# Patient Record
Sex: Male | Born: 1960
Health system: Southern US, Community
[De-identification: ages and names within clinical notes are randomized; demographics above are authoritative.]

## PROBLEM LIST (undated history)

## (undated) DIAGNOSIS — I10 Essential (primary) hypertension: Secondary | ICD-10-CM

## (undated) HISTORY — PX: COLONOSCOPY: SHX174

## (undated) HISTORY — DX: Essential (primary) hypertension: I10

---

## 2018-11-05 DIAGNOSIS — K219 Gastro-esophageal reflux disease without esophagitis: Secondary | ICD-10-CM | POA: Diagnosis not present

## 2018-11-05 DIAGNOSIS — Z0000 Encounter for general adult medical examination without abnormal findings: Secondary | ICD-10-CM | POA: Diagnosis not present

## 2018-11-05 DIAGNOSIS — E349 Endocrine disorder, unspecified: Secondary | ICD-10-CM | POA: Diagnosis not present

## 2018-11-05 DIAGNOSIS — Z131 Encounter for screening for diabetes mellitus: Secondary | ICD-10-CM | POA: Diagnosis not present

## 2018-11-05 DIAGNOSIS — R0683 Snoring: Secondary | ICD-10-CM | POA: Insufficient documentation

## 2018-11-05 DIAGNOSIS — E7800 Pure hypercholesterolemia, unspecified: Secondary | ICD-10-CM | POA: Diagnosis not present

## 2018-11-05 DIAGNOSIS — Z125 Encounter for screening for malignant neoplasm of prostate: Secondary | ICD-10-CM | POA: Diagnosis not present

## 2018-12-07 DIAGNOSIS — R6882 Decreased libido: Secondary | ICD-10-CM | POA: Diagnosis not present

## 2019-06-07 DIAGNOSIS — H40039 Anatomical narrow angle, unspecified eye: Secondary | ICD-10-CM | POA: Diagnosis not present

## 2020-03-06 ENCOUNTER — Ambulatory Visit: Payer: BC Managed Care – PPO | Admitting: Adult Health

## 2020-03-06 ENCOUNTER — Other Ambulatory Visit: Payer: Self-pay

## 2020-03-06 ENCOUNTER — Encounter: Payer: Self-pay | Admitting: Adult Health

## 2020-03-06 VITALS — BP 138/90 | HR 74 | Temp 97.1°F | Resp 16 | Ht 60.3 in | Wt 218.6 lb

## 2020-03-06 DIAGNOSIS — Z23 Encounter for immunization: Secondary | ICD-10-CM

## 2020-03-06 DIAGNOSIS — Z7689 Persons encountering health services in other specified circumstances: Secondary | ICD-10-CM | POA: Diagnosis not present

## 2020-03-06 DIAGNOSIS — E7849 Other hyperlipidemia: Secondary | ICD-10-CM | POA: Diagnosis not present

## 2020-03-06 DIAGNOSIS — N529 Male erectile dysfunction, unspecified: Secondary | ICD-10-CM | POA: Diagnosis not present

## 2020-03-06 DIAGNOSIS — Z125 Encounter for screening for malignant neoplasm of prostate: Secondary | ICD-10-CM

## 2020-03-06 DIAGNOSIS — Z1211 Encounter for screening for malignant neoplasm of colon: Secondary | ICD-10-CM | POA: Diagnosis not present

## 2020-03-06 DIAGNOSIS — Z79899 Other long term (current) drug therapy: Secondary | ICD-10-CM

## 2020-03-06 MED ORDER — TADALAFIL 10 MG PO TABS: 10.0000 mg | ORAL_TABLET | Freq: Every day | ORAL | 0 refills | Status: DC | PRN

## 2020-03-06 NOTE — Progress Notes (Signed)
[facility] [place],  [address]  Internal MEDICINE  Office Visit Note  Patient Name: Fred Reeves  [id number]  [id number]  Date of Service: 03/06/2020   Complaints/HPI Pt is here for establishment of PCP. Chief Complaint  Patient presents with  . New Patient (Initial Visit)   HPI Pt is here to establish care.  Low testosterone, ED, he took testosterone injections for about 9 months. He feels like it made a difference. Bilateral leg cramping.    Quit smoking 30 years, social drinker  Current Medication: Outpatient Encounter Medications as of 03/06/2020  Medication Sig  . rosuvastatin (CRESTOR) 40 MG tablet Take 40 mg by mouth daily.  . sildenafil (VIAGRA) 100 MG tablet Take 100 mg by mouth daily as needed for erectile dysfunction.  . tadalafil (CIALIS) 10 MG tablet Take 1 tablet (10 mg total) by mouth daily as needed for erectile dysfunction.   No facility-administered encounter medications on file as of 03/06/2020.    Surgical History: Past Surgical History:  Procedure Laterality Date  . COLONOSCOPY      Medical History: History reviewed. No pertinent past medical history.  Family History: Family History  Problem Relation Age of Onset  . Stroke Mother   . Aneurysm Brother     Social History   Socioeconomic History  . Marital status: Married    Spouse name: Not on file  . Number of children: Not on file  . Years of education: Not on file  . Highest education level: Not on file  Occupational History  . Not on file  Tobacco Use  . Smoking status: Former Smoker    Types: Cigarettes  . Smokeless tobacco: Never Used  Substance and Sexual Activity  . Alcohol use: Yes    Comment: moderate  . Drug use: Never  . Sexual activity: Not on file  Other Topics Concern  . Not on file  Social History Narrative  . Not on file   Social Determinants of Health   Financial Resource Strain:   . Difficulty of Paying Living Expenses:   Food  Insecurity:   . Worried About Charity fundraiser in the Last Year:   . Arboriculturist in the Last Year:   Transportation Needs:   . Film/video editor (Medical):   Marland Kitchen Lack of Transportation (Non-Medical):   Physical Activity:   . Days of Exercise per Week:   . Minutes of Exercise per Session:   Stress:   . Feeling of Stress :   Social Connections:   . Frequency of Communication with Friends and Family:   . Frequency of Social Gatherings with Friends and Family:   . Attends Religious Services:   . Active Member of Clubs or Organizations:   . Attends Archivist Meetings:   Marland Kitchen Marital Status:   Intimate Partner Violence:   . Fear of Current or Ex-Partner:   . Emotionally Abused:   Marland Kitchen Physically Abused:   . Sexually Abused:      Review of Systems  Constitutional: Negative.  Negative for chills, fatigue and unexpected weight change.  HENT: Negative.  Negative for congestion, rhinorrhea, sneezing and sore throat.   Eyes: Negative for redness.  Respiratory: Negative.  Negative for cough, chest tightness and shortness of breath.   Cardiovascular: Negative.  Negative for chest pain and palpitations.  Gastrointestinal: Negative.  Negative for abdominal pain, constipation, diarrhea, nausea and vomiting.  Endocrine: Negative.   Genitourinary: Negative.  Negative for dysuria  and frequency.  Musculoskeletal: Negative.  Negative for arthralgias, back pain, joint swelling and neck pain.  Skin: Negative.  Negative for rash.  Allergic/Immunologic: Negative.   Neurological: Negative.  Negative for tremors and numbness.  Hematological: Negative for adenopathy. Does not bruise/bleed easily.  Psychiatric/Behavioral: Negative.  Negative for behavioral problems, sleep disturbance and suicidal ideas. The patient is not nervous/anxious.     Vital Signs: BP (!) 149/97   Pulse 74   Temp (!) 97.1 F (36.2 C)   Resp 16   Ht 5' 0.3" (1.532 m)   Wt 218 lb 9.6 oz (99.2 kg)   SpO2 98%    BMI 42.27 kg/m    Physical Exam Vitals and nursing note reviewed.  Constitutional:      General: He is not in acute distress.    Appearance: He is well-developed. He is not diaphoretic.  HENT:     Head: Normocephalic and atraumatic.     Mouth/Throat:     Pharynx: No oropharyngeal exudate.  Eyes:     Pupils: Pupils are equal, round, and reactive to light.  Neck:     Thyroid: No thyromegaly.     Vascular: No JVD.     Trachea: No tracheal deviation.  Cardiovascular:     Rate and Rhythm: Normal rate and regular rhythm.     Heart sounds: Normal heart sounds. No murmur. No friction rub. No gallop.   Pulmonary:     Effort: Pulmonary effort is normal. No respiratory distress.     Breath sounds: Normal breath sounds. No wheezing or rales.  Chest:     Chest wall: No tenderness.  Abdominal:     Palpations: Abdomen is soft.     Tenderness: There is no abdominal tenderness. There is no guarding.  Musculoskeletal:        General: Normal range of motion.     Cervical back: Normal range of motion and neck supple.  Lymphadenopathy:     Cervical: No cervical adenopathy.  Skin:    General: Skin is warm and dry.  Neurological:     Mental Status: He is alert and oriented to person, place, and time.     Cranial Nerves: No cranial nerve deficit.  Psychiatric:        Behavior: Behavior normal.        Thought Content: Thought content normal.        Judgment: Judgment normal.    Assessment/Plan: 1. Encounter to establish care with new doctor - CBC with Differential/Platelet - Lipid Panel With LDL/HDL Ratio - TSH - T4, free - Comprehensive metabolic panel  2. Other hyperlipidemia - rosuvastatin (CRESTOR) 40 MG tablet; Take 40 mg by mouth daily. - Lipid Panel With LDL/HDL Ratio  3. Erectile dysfunction, unspecified erectile dysfunction type - sildenafil (VIAGRA) 100 MG tablet; Take 100 mg by mouth daily as needed for erectile dysfunction. - tadalafil (CIALIS) 10 MG tablet; Take 1  tablet (10 mg total) by mouth daily as needed for erectile dysfunction.  Dispense: 30 tablet; Refill: 0  4. Screen for colon cancer - Ambulatory referral to Gastroenterology  5. Need for shingles vaccine - Varicella-zoster vaccine subcutaneous  6. Screening for prostate cancer - PSA  7. Encounter for long-term (current) use of high-risk medication - CBC with Differential/Platelet - Lipid Panel With LDL/HDL Ratio - TSH - T4, free - Comprehensive metabolic panel  General Counseling: Pascual verbalizes understanding of the findings of todays visit and agrees with plan of treatment. I have discussed any further diagnostic  evaluation that may be needed or ordered today. We also reviewed his medications today. he has been encouraged to call the office with any questions or concerns that should arise related to todays visit.  Orders Placed This Encounter  Procedures  . Varicella-zoster vaccine subcutaneous  . CBC with Differential/Platelet  . Lipid Panel With LDL/HDL Ratio  . TSH  . T4, free  . Comprehensive metabolic panel  . PSA  . Ambulatory referral to Gastroenterology    Meds ordered this encounter  Medications  . tadalafil (CIALIS) 10 MG tablet    Sig: Take 1 tablet (10 mg total) by mouth daily as needed for erectile dysfunction.    Dispense:  30 tablet    Refill:  0    Good Rx BIN [id number], PCN [id number], Group [id number], Member ID [id number]    Time spent: 30 Minutes   This patient was seen by Orson Gear AGNP-C in Collaboration with Dr Lavera Guise as a part of collaborative care agreement  Kendell Bane AGNP-C Internal Medicine

## 2020-03-09 ENCOUNTER — Telehealth: Payer: Self-pay

## 2020-03-09 DIAGNOSIS — Z79899 Other long term (current) drug therapy: Secondary | ICD-10-CM | POA: Diagnosis not present

## 2020-03-09 DIAGNOSIS — E7849 Other hyperlipidemia: Secondary | ICD-10-CM | POA: Diagnosis not present

## 2020-03-09 DIAGNOSIS — Z7689 Persons encountering health services in other specified circumstances: Secondary | ICD-10-CM | POA: Diagnosis not present

## 2020-03-09 DIAGNOSIS — Z125 Encounter for screening for malignant neoplasm of prostate: Secondary | ICD-10-CM | POA: Diagnosis not present

## 2020-03-09 NOTE — Telephone Encounter (Signed)
Confirmed appointment on 03/13/2020 and screened for covid. [facility]  

## 2020-03-10 LAB — CBC WITH DIFFERENTIAL/PLATELET
Basophils Absolute: 0.1 10*3/uL (ref 0.0–0.2)
Basos: 1 %
EOS (ABSOLUTE): 0.1 10*3/uL (ref 0.0–0.4)
Eos: 3 %
Hematocrit: 43.5 % (ref 37.5–51.0)
Hemoglobin: 14.3 g/dL (ref 13.0–17.7)
Immature Grans (Abs): 0 10*3/uL (ref 0.0–0.1)
Immature Granulocytes: 0 %
Lymphocytes Absolute: 2.1 10*3/uL (ref 0.7–3.1)
Lymphs: 37 %
MCH: 27.9 pg (ref 26.6–33.0)
MCHC: 32.9 g/dL (ref 31.5–35.7)
MCV: 85 fL (ref 79–97)
Monocytes Absolute: 0.6 10*3/uL (ref 0.1–0.9)
Monocytes: 10 %
Neutrophils Absolute: 2.8 10*3/uL (ref 1.4–7.0)
Neutrophils: 49 %
Platelets: 224 10*3/uL (ref 150–450)
RBC: 5.13 x10E6/uL (ref 4.14–5.80)
RDW: 12.9 % (ref 11.6–15.4)
WBC: 5.7 10*3/uL (ref 3.4–10.8)

## 2020-03-10 LAB — LIPID PANEL WITH LDL/HDL RATIO
Cholesterol, Total: 159 mg/dL (ref 100–199)
HDL: 69 mg/dL (ref >39)
LDL Chol Calc (NIH): 79 mg/dL (ref 0–99)
LDL/HDL Ratio: 1.1 ratio (ref 0.0–3.6)
Triglycerides: 55 mg/dL (ref 0–149)
VLDL Cholesterol Cal: 11 mg/dL (ref 5–40)

## 2020-03-10 LAB — COMPREHENSIVE METABOLIC PANEL
ALT: 11 IU/L (ref 0–44)
AST: 16 IU/L (ref 0–40)
Albumin/Globulin Ratio: 1.7 (ref 1.2–2.2)
Albumin: 4.5 g/dL (ref 3.8–4.8)
Alkaline Phosphatase: 73 IU/L (ref 48–121)
BUN/Creatinine Ratio: 13 (ref 10–24)
BUN: 15 mg/dL (ref 8–27)
Bilirubin Total: 0.4 mg/dL (ref 0.0–1.2)
CO2: 24 mmol/L (ref 20–29)
Calcium: 9.5 mg/dL (ref 8.6–10.2)
Chloride: 103 mmol/L (ref 96–106)
Creatinine, Ser: 1.2 mg/dL (ref 0.76–1.27)
GFR calc Af Amer: 75 mL/min/{1.73_m2} (ref >59)
GFR calc non Af Amer: 65 mL/min/{1.73_m2} (ref >59)
Globulin, Total: 2.6 g/dL (ref 1.5–4.5)
Glucose: 107 mg/dL — ABNORMAL HIGH (ref 65–99)
Potassium: 4.8 mmol/L (ref 3.5–5.2)
Sodium: 140 mmol/L (ref 134–144)
Total Protein: 7.1 g/dL (ref 6.0–8.5)

## 2020-03-10 LAB — TSH: TSH: 1.63 u[IU]/mL (ref 0.450–4.500)

## 2020-03-10 LAB — T4, FREE: Free T4: 1.01 ng/dL (ref 0.82–1.77)

## 2020-03-10 LAB — PSA: Prostate Specific Ag, Serum: 1 ng/mL (ref 0.0–4.0)

## 2020-03-13 ENCOUNTER — Ambulatory Visit
Admission: RE | Admit: 2020-03-13 | Discharge: 2020-03-13 | Disposition: A | Discharge status: Home / Self Care | Payer: BC Managed Care – PPO | Source: Ambulatory Visit | Attending: Adult Health | Admitting: Adult Health

## 2020-03-13 ENCOUNTER — Encounter: Payer: Self-pay | Admitting: Adult Health

## 2020-03-13 ENCOUNTER — Ambulatory Visit (INDEPENDENT_AMBULATORY_CARE_PROVIDER_SITE_OTHER): Payer: BC Managed Care – PPO | Admitting: Adult Health

## 2020-03-13 ENCOUNTER — Other Ambulatory Visit: Payer: Self-pay

## 2020-03-13 VITALS — BP 132/66 | HR 67 | Temp 97.5°F | Resp 16 | Ht 60.36 in | Wt 216.8 lb

## 2020-03-13 DIAGNOSIS — J90 Pleural effusion, not elsewhere classified: Secondary | ICD-10-CM | POA: Diagnosis not present

## 2020-03-13 DIAGNOSIS — R7301 Impaired fasting glucose: Secondary | ICD-10-CM

## 2020-03-13 DIAGNOSIS — Z0001 Encounter for general adult medical examination with abnormal findings: Secondary | ICD-10-CM | POA: Diagnosis not present

## 2020-03-13 DIAGNOSIS — J984 Other disorders of lung: Secondary | ICD-10-CM | POA: Diagnosis not present

## 2020-03-13 DIAGNOSIS — R7303 Prediabetes: Secondary | ICD-10-CM | POA: Diagnosis not present

## 2020-03-13 DIAGNOSIS — R05 Cough: Secondary | ICD-10-CM | POA: Insufficient documentation

## 2020-03-13 DIAGNOSIS — R059 Cough, unspecified: Secondary | ICD-10-CM

## 2020-03-13 DIAGNOSIS — R0600 Dyspnea, unspecified: Secondary | ICD-10-CM

## 2020-03-13 DIAGNOSIS — E7849 Other hyperlipidemia: Secondary | ICD-10-CM

## 2020-03-13 DIAGNOSIS — R300 Dysuria: Secondary | ICD-10-CM

## 2020-03-13 DIAGNOSIS — N529 Male erectile dysfunction, unspecified: Secondary | ICD-10-CM

## 2020-03-13 DIAGNOSIS — R0609 Other forms of dyspnea: Secondary | ICD-10-CM

## 2020-03-13 LAB — POCT GLYCOSYLATED HEMOGLOBIN (HGB A1C): Hemoglobin A1C: 5.8 % — AB (ref 4.0–5.6)

## 2020-03-13 NOTE — Progress Notes (Signed)
[facility] [place], [place] [address]  Internal MEDICINE  Office Visit Note  Patient Name: Fred Reeves  [id number]  [id number]  Date of Service: 03/13/2020  Chief Complaint  Patient presents with  . Annual Exam     HPI Pt is here for routine health maintenance examination. He is a well appearing 59 yo AA male.  His history includes low testosterone, and ED. He denies any complaints at this time.  His recent labs are reviewed with him.     Current Medication: Outpatient Encounter Medications as of 03/13/2020  Medication Sig  . rosuvastatin (CRESTOR) 40 MG tablet Take 40 mg by mouth daily.  . sildenafil (VIAGRA) 100 MG tablet Take 100 mg by mouth daily as needed for erectile dysfunction.  . tadalafil (CIALIS) 10 MG tablet Take 1 tablet (10 mg total) by mouth daily as needed for erectile dysfunction.   No facility-administered encounter medications on file as of 03/13/2020.    Surgical History: Past Surgical History:  Procedure Laterality Date  . COLONOSCOPY      Medical History: History reviewed. No pertinent past medical history.  Family History: Family History  Problem Relation Age of Onset  . Stroke Mother   . Aneurysm Brother       Review of Systems  Constitutional: Negative.  Negative for chills, fatigue and unexpected weight change.  HENT: Negative.  Negative for congestion, rhinorrhea, sneezing and sore throat.   Eyes: Negative for redness.  Respiratory: Negative.  Negative for cough, chest tightness and shortness of breath.   Cardiovascular: Negative.  Negative for chest pain and palpitations.  Gastrointestinal: Negative.  Negative for abdominal pain, constipation, diarrhea, nausea and vomiting.  Endocrine: Negative.   Genitourinary: Negative.  Negative for dysuria and frequency.  Musculoskeletal: Negative.  Negative for arthralgias, back pain, joint swelling and neck pain.  Skin: Negative.  Negative for rash.   Allergic/Immunologic: Negative.   Neurological: Negative.  Negative for tremors and numbness.  Hematological: Negative for adenopathy. Does not bruise/bleed easily.  Psychiatric/Behavioral: Negative.  Negative for behavioral problems, sleep disturbance and suicidal ideas. The patient is not nervous/anxious.      Vital Signs: BP (!) 153/87   Pulse 67   Temp (!) 97.5 F (36.4 C)   Resp 16   Ht 5' 0.36" (1.533 m)   Wt 216 lb 12.8 oz (98.3 kg)   SpO2 99%   BMI 41.84 kg/m    Physical Exam Vitals and nursing note reviewed.  Constitutional:      General: He is not in acute distress.    Appearance: He is well-developed. He is not diaphoretic.  HENT:     Head: Normocephalic and atraumatic.     Mouth/Throat:     Pharynx: No oropharyngeal exudate.  Eyes:     Pupils: Pupils are equal, round, and reactive to light.  Neck:     Thyroid: No thyromegaly.     Vascular: No JVD.     Trachea: No tracheal deviation.  Cardiovascular:     Rate and Rhythm: Normal rate and regular rhythm.     Heart sounds: Normal heart sounds. No murmur heard.  No friction rub. No gallop.   Pulmonary:     Effort: Pulmonary effort is normal. No respiratory distress.     Breath sounds: Normal breath sounds. No wheezing or rales.  Chest:     Chest wall: No tenderness.  Abdominal:     Palpations: Abdomen is soft.     Tenderness: There is  no abdominal tenderness. There is no guarding.  Musculoskeletal:        General: Normal range of motion.     Cervical back: Normal range of motion and neck supple.  Lymphadenopathy:     Cervical: No cervical adenopathy.  Skin:    General: Skin is warm and dry.  Neurological:     Mental Status: He is alert and oriented to person, place, and time.     Cranial Nerves: No cranial nerve deficit.  Psychiatric:        Behavior: Behavior normal.        Thought Content: Thought content normal.        Judgment: Judgment normal.      LABS: Recent Results (from the past  2160 hour(s))  CBC with Differential/Platelet     Status: None   Collection Time: 03/09/20 11:03 AM  Result Value Ref Range   WBC 5.7 3.4 - 10.8 x10E3/uL   RBC 5.13 4.14 - 5.80 x10E6/uL   Hemoglobin 14.3 13.0 - 17.7 g/dL   Hematocrit 29.7 88.3 - 51.0 %   MCV 85 79 - 97 fL   MCH 27.9 26.6 - 33.0 pg   MCHC 32.9 31 - 35 g/dL   RDW 68.2 15.1 - 15.9 %   Platelets 224 150 - 450 x10E3/uL   Neutrophils 49 Not Estab. %   Lymphs 37 Not Estab. %   Monocytes 10 Not Estab. %   Eos 3 Not Estab. %   Basos 1 Not Estab. %   Neutrophils Absolute 2.8 1 - 7 x10E3/uL   Lymphocytes Absolute 2.1 0 - 3 x10E3/uL   Monocytes Absolute 0.6 0 - 0 x10E3/uL   EOS (ABSOLUTE) 0.1 0.0 - 0.4 x10E3/uL   Basophils Absolute 0.1 0 - 0 x10E3/uL   Immature Granulocytes 0 Not Estab. %   Immature Grans (Abs) 0.0 0.0 - 0.1 x10E3/uL  Lipid Panel With LDL/HDL Ratio     Status: None   Collection Time: 03/09/20 11:03 AM  Result Value Ref Range   Cholesterol, Total 159 100 - 199 mg/dL   Triglycerides 55 0 - 149 mg/dL   HDL 69 >57 mg/dL   VLDL Cholesterol Cal 11 5 - 40 mg/dL   LDL Chol Calc (NIH) 79 0 - 99 mg/dL   LDL/HDL Ratio 1.1 0.0 - 3.6 ratio    Comment:                                     LDL/HDL Ratio                                             Men  Women                               1/2 Avg.Risk  1.0    1.5                                   Avg.Risk  3.6    3.2                                2X  Avg.Risk  6.2    5.0                                3X Avg.Risk  8.0    6.1   TSH     Status: None   Collection Time: 03/09/20 11:03 AM  Result Value Ref Range   TSH 1.630 0.450 - 4.500 uIU/mL  T4, free     Status: None   Collection Time: 03/09/20 11:03 AM  Result Value Ref Range   Free T4 1.01 0.82 - 1.77 ng/dL  Comprehensive metabolic panel     Status: Abnormal   Collection Time: 03/09/20 11:03 AM  Result Value Ref Range   Glucose 107 (H) 65 - 99 mg/dL   BUN 15 8 - 27 mg/dL   Creatinine, Ser 9.49 0.76 - 1.27  mg/dL   GFR calc non Af Amer 65 >59 mL/min/1.73   GFR calc Af Amer 75 >59 mL/min/1.73    Comment: **[facility] currently reports eGFR in compliance with the current**   recommendations of the [employer]. [facility] will   update reporting as new guidelines are published from the NKF-ASN   Task force.    BUN/Creatinine Ratio 13 10 - 24   Sodium 140 134 - 144 mmol/L   Potassium 4.8 3.5 - 5.2 mmol/L   Chloride 103 96 - 106 mmol/L   CO2 24 20 - 29 mmol/L   Calcium 9.5 8.6 - 10.2 mg/dL   Total Protein 7.1 6.0 - 8.5 g/dL   Albumin 4.5 3.8 - 4.8 g/dL   Globulin, Total 2.6 1.5 - 4.5 g/dL   Albumin/Globulin Ratio 1.7 1.2 - 2.2   Bilirubin Total 0.4 0.0 - 1.2 mg/dL   Alkaline Phosphatase 73 48 - 121 IU/L   AST 16 0 - 40 IU/L   ALT 11 0 - 44 IU/L  PSA     Status: None   Collection Time: 03/09/20 11:03 AM  Result Value Ref Range   Prostate Specific Ag, Serum 1.0 0.0 - 4.0 ng/mL    Comment: Roche ECLIA methodology. According to the American Urological Association, Serum PSA should decrease and remain at undetectable levels after radical prostatectomy. The AUA defines biochemical recurrence as an initial PSA value 0.2 ng/mL or greater followed by a subsequent confirmatory PSA value 0.2 ng/mL or greater. Values obtained with different assay methods or kits cannot be used interchangeably. Results cannot be interpreted as absolute evidence of the presence or absence of malignant disease.   POCT HgB A1C     Status: Abnormal   Collection Time: 03/13/20 10:08 AM  Result Value Ref Range   Hemoglobin A1C 5.8 (A) 4.0 - 5.6 %   HbA1c POC (<> result, manual entry)     HbA1c, POC (prediabetic range)     HbA1c, POC (controlled diabetic range)       Assessment/Plan: 1. Encounter for general adult medical examination with abnormal findings Up to date on PHM  2. Impaired fasting glucose A1C 5.8 - POCT HgB A1C  3. Cough - DG Chest 2 View; Future  4. DOE (dyspnea on exertion) Have  Echo nad Chext x-ray done to eval sob.  - DG Chest 2 View; Future - ECHOCARDIOGRAM COMPLETE; Future  5. Other hyperlipidemia Lipid panel WNL. Continue current management.   6. Erectile dysfunction, unspecified erectile dysfunction type Continue with present management.   7. Prediabetes Discussed weight loss and lifestyle modifications to keep average  blood glucose down.    General Counseling: Fred Reeves understanding of the findings of todays visit and agrees with plan of treatment. I have discussed any further diagnostic evaluation that may be needed or ordered today. We also reviewed his medications today. he has been encouraged to call the office with any questions or concerns that should arise related to todays visit.   Orders Placed This Encounter  Procedures  . POCT HgB A1C    No orders of the defined types were placed in this encounter.   Time spent: 30 Minutes   This patient was seen by Orson Gear AGNP-C in Collaboration with Dr Lavera Guise as a part of collaborative care agreement    Fred Reeves AGNP-C Internal Medicine

## 2020-03-14 LAB — URINALYSIS, ROUTINE W REFLEX MICROSCOPIC
Bilirubin, UA: NEGATIVE
Glucose, UA: NEGATIVE
Ketones, UA: NEGATIVE
Leukocytes,UA: NEGATIVE
Nitrite, UA: NEGATIVE
Protein,UA: NEGATIVE
RBC, UA: NEGATIVE
Specific Gravity, UA: 1.025 (ref 1.005–1.030)
Urobilinogen, Ur: 0.2 mg/dL (ref 0.2–1.0)
pH, UA: 5 (ref 5.0–7.5)

## 2020-03-16 ENCOUNTER — Telehealth (INDEPENDENT_AMBULATORY_CARE_PROVIDER_SITE_OTHER): Payer: Self-pay | Admitting: Gastroenterology

## 2020-03-16 ENCOUNTER — Other Ambulatory Visit: Payer: Self-pay

## 2020-03-16 DIAGNOSIS — Z1211 Encounter for screening for malignant neoplasm of colon: Secondary | ICD-10-CM

## 2020-03-16 NOTE — Progress Notes (Signed)
Gastroenterology Pre-Procedure Review  Request Date: July 13th Requesting Physician: Dr. Vicente Males  PATIENT REVIEW QUESTIONS: The patient responded to the following health history questions as indicated:    1. Are you having any GI issues? GERD, Reflux takes Protonix.  Symptoms vary depending on diet. 2. Do you have a personal history of Polyps? no 3. Do you have a family history of Colon Cancer or Polyps? unsure 4. Diabetes Mellitus? no 5. Joint replacements in the past 12 months?no 6. Major health problems in the past 3 months?no 7. Any artificial heart valves, MVP, or defibrillator?no    MEDICATIONS & ALLERGIES:    Patient reports the following regarding taking any anticoagulation/antiplatelet therapy:   Plavix, Coumadin, Eliquis, Xarelto, Lovenox, Pradaxa, Brilinta, or Effient? no Aspirin? no  Patient confirms/reports the following medications:  Current Outpatient Medications  Medication Sig Dispense Refill   Glucosamine HCl (GLUCOSAMINE PO) Take by mouth.     pantoprazole (PROTONIX) 40 MG tablet Take by mouth.     rosuvastatin (CRESTOR) 40 MG tablet Take 40 mg by mouth daily.     sildenafil (REVATIO) 20 MG tablet Take by mouth.     sildenafil (VIAGRA) 100 MG tablet Take 100 mg by mouth daily as needed for erectile dysfunction.     tadalafil (CIALIS) 10 MG tablet Take 1 tablet (10 mg total) by mouth daily as needed for erectile dysfunction. 30 tablet 0   cephALEXin (KEFLEX) 500 MG capsule Keflex 500 mg capsule  Take 1 capsule every 6 hours by oral route for 5 days. (Patient not taking: Reported on 03/16/2020)     Cholecalciferol 25 MCG (1000 UT) tablet Take by mouth. (Patient not taking: Reported on 03/16/2020)     No current facility-administered medications for this visit.    Patient confirms/reports the following allergies:  No Known Allergies  No orders of the defined types were placed in this encounter.   AUTHORIZATION INFORMATION Primary Insurance: 1D#: Group  #:  Secondary Insurance: 1D#: Group #:  SCHEDULE INFORMATION: Date: Tuesday 04/10/20 Time: Location:[facility]

## 2020-04-04 ENCOUNTER — Telehealth: Payer: Self-pay

## 2020-04-04 NOTE — Telephone Encounter (Signed)
Confirmed patient ultrasound appt 

## 2020-04-06 ENCOUNTER — Ambulatory Visit: Payer: BC Managed Care – PPO

## 2020-04-06 ENCOUNTER — Other Ambulatory Visit: Payer: Self-pay

## 2020-04-06 ENCOUNTER — Other Ambulatory Visit
Admission: RE | Admit: 2020-04-06 | Discharge: 2020-04-06 | Disposition: A | Discharge status: Home / Self Care | Payer: BC Managed Care – PPO | Source: Ambulatory Visit | Attending: Gastroenterology | Admitting: Gastroenterology

## 2020-04-06 DIAGNOSIS — Z20822 Contact with and (suspected) exposure to covid-19: Secondary | ICD-10-CM | POA: Insufficient documentation

## 2020-04-06 DIAGNOSIS — Z01812 Encounter for preprocedural laboratory examination: Secondary | ICD-10-CM | POA: Insufficient documentation

## 2020-04-06 DIAGNOSIS — R0602 Shortness of breath: Secondary | ICD-10-CM | POA: Diagnosis not present

## 2020-04-06 DIAGNOSIS — R0609 Other forms of dyspnea: Secondary | ICD-10-CM

## 2020-04-07 LAB — SARS CORONAVIRUS 2 (TAT 6-24 HRS): SARS Coronavirus 2: NEGATIVE

## 2020-04-10 ENCOUNTER — Ambulatory Visit: Payer: BC Managed Care – PPO | Admitting: Certified Registered"

## 2020-04-10 ENCOUNTER — Encounter
Admission: RE | Disposition: A | Discharge status: Home / Self Care | Payer: Self-pay | Source: Home / Self Care | Attending: Gastroenterology

## 2020-04-10 ENCOUNTER — Ambulatory Visit
Admission: RE | Admit: 2020-04-10 | Discharge: 2020-04-10 | Disposition: A | Discharge status: Home / Self Care | Payer: BC Managed Care – PPO | Attending: Gastroenterology | Admitting: Gastroenterology

## 2020-04-10 ENCOUNTER — Telehealth: Payer: Self-pay

## 2020-04-10 ENCOUNTER — Other Ambulatory Visit: Payer: Self-pay

## 2020-04-10 ENCOUNTER — Ambulatory Visit: Payer: BC Managed Care – PPO | Admitting: Adult Health

## 2020-04-10 ENCOUNTER — Encounter: Payer: Self-pay | Admitting: Gastroenterology

## 2020-04-10 DIAGNOSIS — K219 Gastro-esophageal reflux disease without esophagitis: Secondary | ICD-10-CM | POA: Diagnosis not present

## 2020-04-10 DIAGNOSIS — Z1211 Encounter for screening for malignant neoplasm of colon: Secondary | ICD-10-CM | POA: Insufficient documentation

## 2020-04-10 DIAGNOSIS — Z87891 Personal history of nicotine dependence: Secondary | ICD-10-CM | POA: Diagnosis not present

## 2020-04-10 DIAGNOSIS — D122 Benign neoplasm of ascending colon: Secondary | ICD-10-CM | POA: Diagnosis not present

## 2020-04-10 DIAGNOSIS — D125 Benign neoplasm of sigmoid colon: Secondary | ICD-10-CM | POA: Diagnosis not present

## 2020-04-10 DIAGNOSIS — Z79899 Other long term (current) drug therapy: Secondary | ICD-10-CM | POA: Diagnosis not present

## 2020-04-10 DIAGNOSIS — K635 Polyp of colon: Secondary | ICD-10-CM | POA: Diagnosis not present

## 2020-04-10 DIAGNOSIS — D126 Benign neoplasm of colon, unspecified: Secondary | ICD-10-CM | POA: Diagnosis not present

## 2020-04-10 HISTORY — PX: COLONOSCOPY WITH PROPOFOL: SHX5780

## 2020-04-10 SURGERY — COLONOSCOPY WITH PROPOFOL
Anesthesia: General

## 2020-04-10 MED ORDER — MIDAZOLAM HCL 2 MG/2ML IJ SOLN
INTRAMUSCULAR | Status: AC
  Filled 2020-04-10: qty 2

## 2020-04-10 MED ORDER — GLYCOPYRROLATE 0.2 MG/ML IJ SOLN
INTRAMUSCULAR | Status: DC | PRN
  Administered 2020-04-10: .2 mg via INTRAVENOUS

## 2020-04-10 MED ORDER — SODIUM CHLORIDE 0.9 % IV SOLN: INTRAVENOUS | Status: DC

## 2020-04-10 MED ORDER — PROPOFOL 500 MG/50ML IV EMUL
INTRAVENOUS | Status: DC | PRN
  Administered 2020-04-10: 165 ug/kg/min via INTRAVENOUS

## 2020-04-10 MED ORDER — LIDOCAINE HCL (CARDIAC) PF 100 MG/5ML IV SOSY
PREFILLED_SYRINGE | INTRAVENOUS | Status: DC | PRN
  Administered 2020-04-10: 100 mg via INTRAVENOUS

## 2020-04-10 MED ORDER — PROPOFOL 10 MG/ML IV BOLUS
INTRAVENOUS | Status: DC | PRN
  Administered 2020-04-10: 40 mg via INTRAVENOUS
  Administered 2020-04-10 (×3): 10 mg via INTRAVENOUS

## 2020-04-10 NOTE — Op Note (Signed)
[facility] Gastroenterology Patient Name: Fred Reeves Procedure Date: 04/10/2020 9:17 AM MRN: [id number] Account #: [id number] Date of Birth: 1961-07-25 Admit Type: Outpatient Age: 59 Room: [facility] ENDO ROOM 1 Gender: Male Note Status: Finalized Procedure:             Colonoscopy Indications:           Screening for colorectal malignant neoplasm Providers:             [name] MD, MD Medicines:             Monitored Anesthesia Care Complications:         No immediate complications. Procedure:             Pre-Anesthesia Assessment:                        - Prior to the procedure, a History and Physical was                         performed, and patient medications, allergies and                         sensitivities were reviewed. The patient's tolerance                         of previous anesthesia was reviewed.                        - The risks and benefits of the procedure and the                         sedation options and risks were discussed with the                         patient. All questions were answered and informed                         consent was obtained.                        - ASA Grade Assessment: II - A patient with mild                         systemic disease.                        After obtaining informed consent, the colonoscope was                         passed under direct vision. Throughout the procedure,                         the patient's blood pressure, pulse, and oxygen                         saturations were monitored continuously. The                         Colonoscope was introduced through the anus and  advanced to the the cecum, identified by the                         appendiceal orifice. The colonoscopy was performed                         with ease. The patient tolerated the procedure well.                         The quality of the bowel preparation was excellent. Findings:      The  perianal and digital rectal examinations were normal.      A 4 mm polyp was found in the sigmoid colon. The polyp was sessile. The       polyp was removed with a cold snare. Resection and retrieval were       complete.      Two sessile polyps were found in the ascending colon. The polyps were 4       to 6 mm in size. These polyps were removed with a cold snare. Resection       and retrieval were complete.      The exam was otherwise without abnormality on direct and retroflexion       views. Impression:            - One 4 mm polyp in the sigmoid colon, removed with a                         cold snare. Resected and retrieved.                        - Two 4 to 6 mm polyps in the ascending colon, removed                         with a cold snare. Resected and retrieved.                        - The examination was otherwise normal on direct and                         retroflexion views. Recommendation:        - Discharge patient to home (with escort).                        - Resume previous diet.                        - Continue present medications.                        - Await pathology results.                        - Repeat colonoscopy for surveillance based on                         pathology results. Procedure Code(s):     --- Professional ---                        [phone], Colonoscopy, flexible; with removal of  tumor(s), polyp(s), or other lesion(s) by snare                         technique Diagnosis Code(s):     --- Professional ---                        K63.5, Polyp of colon                        Z12.11, Encounter for screening for malignant neoplasm                         of colon CPT copyright 2019 American Medical Association. All rights reserved. The codes documented in this report are preliminary and upon coder review may  be revised to meet current compliance requirements. [name], MD [name] MD, MD 04/10/2020 9:47:57 AM This  report has been signed electronically. Number of Addenda: 0 Note Initiated On: 04/10/2020 9:17 AM Scope Withdrawal Time: 0 hours 10 minutes 48 seconds  Total Procedure Duration: 0 hours 13 minutes 51 seconds  Estimated Blood Loss:  Estimated blood loss: none.      [facility]

## 2020-04-10 NOTE — Anesthesia Procedure Notes (Signed)
Procedure Name: General with mask airway Performed by: Fletcher-Harrison, Donnia Poplaski, CRNA Pre-anesthesia Checklist: Patient identified, Emergency Drugs available, Suction available and Patient being monitored Patient Re-evaluated:Patient Re-evaluated prior to induction Oxygen Delivery Method: Simple face mask Induction Type: IV induction Placement Confirmation: CO2 detector and positive ETCO2 Dental Injury: Teeth and Oropharynx as per pre-operative assessment        

## 2020-04-10 NOTE — Hospital Discharge Follow-Up (Signed)
Fred Bellows, MD [address] [place], [place], [place], [address] [address], [place], [place], [place], [address] Phone: [phone]  Fax: [phone]  Primary Care Physician:  Kendell Bane, NP   Pre-Procedure History & Physical: HPI:  Fred Reeves is a 59 y.o. male is here for an colonoscopy.   History reviewed. No pertinent past medical history.  Past Surgical History:  Procedure Laterality Date  . COLONOSCOPY      Prior to Admission medications   Medication Sig Start Date End Date Taking? Authorizing Provider  Glucosamine HCl (GLUCOSAMINE PO) Take by mouth.   Yes [provider]  pantoprazole (PROTONIX) 40 MG tablet Take by mouth.   Yes [provider]  rosuvastatin (CRESTOR) 40 MG tablet Take 40 mg by mouth daily.   Yes [provider]  cephALEXin (KEFLEX) 500 MG capsule Keflex 500 mg capsule  Take 1 capsule every 6 hours by oral route for 5 days. Patient not taking: Reported on 03/16/2020    [provider]  Cholecalciferol 25 MCG (1000 UT) tablet Take by mouth. Patient not taking: Reported on 03/16/2020    [provider]  sildenafil (REVATIO) 20 MG tablet Take by mouth. 11/09/18   [provider]  sildenafil (VIAGRA) 100 MG tablet Take 100 mg by mouth daily as needed for erectile dysfunction.    [provider]  tadalafil (CIALIS) 10 MG tablet Take 1 tablet (10 mg total) by mouth daily as needed for erectile dysfunction. 03/06/20   Kendell Bane, NP    Allergies as of 03/16/2020  . (No Known Allergies)    Family History  Problem Relation Age of Onset  . Stroke Mother   . Aneurysm Brother     Social History   Socioeconomic History  . Marital status: Married    Spouse name: Not on file  . Number of children: Not on file  . Years of education: Not on file  . Highest education level: Not on file  Occupational History  . Not on file  Tobacco Use  . Smoking status: Former Smoker     Types: Cigarettes  . Smokeless tobacco: Never Used  Substance and Sexual Activity  . Alcohol use: Yes    Comment: moderate  . Drug use: Never  . Sexual activity: Not on file  Other Topics Concern  . Not on file  Social History Narrative  . Not on file   Social Determinants of Health   Financial Resource Strain:   . Difficulty of Paying Living Expenses:   Food Insecurity:   . Worried About Charity fundraiser in the Last Year:   . Arboriculturist in the Last Year:   Transportation Needs:   . Film/video editor (Medical):   Marland Kitchen Lack of Transportation (Non-Medical):   Physical Activity:   . Days of Exercise per Week:   . Minutes of Exercise per Session:   Stress:   . Feeling of Stress :   Social Connections:   . Frequency of Communication with Friends and Family:   . Frequency of Social Gatherings with Friends and Family:   . Attends Religious Services:   . Active Member of Clubs or Organizations:   . Attends Archivist Meetings:   Marland Kitchen Marital Status:   Intimate Partner Violence:   . Fear of Current or Ex-Partner:   . Emotionally Abused:   Marland Kitchen Physically Abused:   . Sexually Abused:     Review  of Systems: See HPI, otherwise negative ROS  Physical Exam: Pulse 63   Temp (!) 96.5 F (35.8 C) (Temporal)   Resp 18   Ht 5' 8.5" (1.74 m)   Wt 94.8 kg   SpO2 100%   BMI 31.32 kg/m  General:   Alert,  pleasant and cooperative in NAD Head:  Normocephalic and atraumatic. Neck:  Supple; no masses or thyromegaly. Lungs:  Clear throughout to auscultation, normal respiratory effort.    Heart:  +S1, +S2, Regular rate and rhythm, No edema. Abdomen:  Soft, nontender and nondistended. Normal bowel sounds, without guarding, and without rebound.   Neurologic:  Alert and  oriented x4;  grossly normal neurologically.  Impression/Plan: [name] is here for an colonoscopy to be performed for Screening colonoscopy average risk   Risks, benefits, limitations, and  alternatives regarding  colonoscopy have been reviewed with the patient.  Questions have been answered.  All parties agreeable.   Fred Bellows, MD  04/10/2020, 9:19 AM

## 2020-04-10 NOTE — Transfer of Care (Signed)
Immediate Anesthesia Transfer of Care Note  Patient: Fred Reeves  Procedure(s) Performed: COLONOSCOPY WITH PROPOFOL (N/A )  Patient Location: Endoscopy Unit  Anesthesia Type:General  Level of Consciousness: drowsy and patient cooperative  Airway & Oxygen Therapy: Patient Spontanous Breathing and Patient connected to face mask oxygen  Post-op Assessment: Report given to RN and Post -op Vital signs reviewed and stable  Post vital signs: Reviewed and stable  Last Vitals:  Vitals Value Taken Time  BP 91/59 04/10/20 0951  Temp 36.1 C 04/10/20 0951  Pulse 62 04/10/20 0955  Resp 15 04/10/20 0955  SpO2 100 % 04/10/20 0955  Vitals shown include unvalidated device data.  Last Pain:  Vitals:   04/10/20 0951  TempSrc:   PainSc: Asleep         Complications: No complications documented.

## 2020-04-10 NOTE — Anesthesia Postprocedure Evaluation (Signed)
Anesthesia Post Note  Patient: Fred Reeves  Procedure(s) Performed: COLONOSCOPY WITH PROPOFOL (N/A )  Patient location during evaluation: Endoscopy Anesthesia Type: General Level of consciousness: awake and alert Pain management: pain level controlled Vital Signs Assessment: post-procedure vital signs reviewed and stable Respiratory status: spontaneous breathing, nonlabored ventilation, respiratory function stable and patient connected to nasal cannula oxygen Cardiovascular status: blood pressure returned to baseline and stable Postop Assessment: no apparent nausea or vomiting Anesthetic complications: no   No complications documented.   Last Vitals:  Vitals:   04/10/20 1000 04/10/20 1010  BP: (!) 116/93 103/74  Pulse: 62 60  Resp: 17 18  Temp:    SpO2: 100% 100%    Last Pain:  Vitals:   04/10/20 1010  TempSrc:   PainSc: 0-No pain                 Arita Miss

## 2020-04-10 NOTE — Anesthesia Preprocedure Evaluation (Signed)
Anesthesia Evaluation  Patient identified by MRN, date of birth, ID band Patient awake    Reviewed: Allergy & Precautions, NPO status , Patient's Chart, lab work & pertinent test results  History of Anesthesia Complications Negative for: history of anesthetic complications  Airway Mallampati: I  TM Distance: >3 FB Neck ROM: Full    Dental no notable dental hx. (+) Teeth Intact   Pulmonary neg pulmonary ROS, neg sleep apnea, neg COPD, Patient abstained from smoking.Not current smoker, former smoker,    Pulmonary exam normal breath sounds clear to auscultation       Cardiovascular Exercise Tolerance: Good METS(-) hypertension(-) CAD and (-) Past MI negative cardio ROS  (-) dysrhythmias  Rhythm:Regular Rate:Normal - Systolic murmurs    Neuro/Psych negative neurological ROS  negative psych ROS   GI/Hepatic GERD  Medicated and Controlled,(+)     (-) substance abuse  ,   Endo/Other  neg diabetes  Renal/GU negative Renal ROS     Musculoskeletal   Abdominal   Peds  Hematology   Anesthesia Other Findings History reviewed. No pertinent past medical history.  Reproductive/Obstetrics                             Anesthesia Physical Anesthesia Plan  ASA: II  Anesthesia Plan: General   Post-op Pain Management:    Induction: Intravenous  PONV Risk Score and Plan: 2 and Ondansetron, Propofol infusion and TIVA  Airway Management Planned: Nasal Cannula  Additional Equipment: None  Intra-op Plan:   Post-operative Plan:   Informed Consent: I have reviewed the patients History and Physical, chart, labs and discussed the procedure including the risks, benefits and alternatives for the proposed anesthesia with the patient or authorized representative who has indicated his/her understanding and acceptance.     Dental advisory given  Plan Discussed with: CRNA and Surgeon  Anesthesia Plan  Comments: (Discussed risks of anesthesia with patient, including possibility of difficulty with spontaneous ventilation under anesthesia necessitating airway intervention, PONV, and rare risks such as cardiac or respiratory or neurological events. Patient understands.)        Anesthesia Quick Evaluation

## 2020-04-10 NOTE — Telephone Encounter (Signed)
CONFIRMED PATIENT APPT. -AR °

## 2020-04-11 ENCOUNTER — Encounter: Payer: Self-pay | Admitting: Gastroenterology

## 2020-04-11 LAB — SURGICAL PATHOLOGY

## 2020-04-11 NOTE — H&P (Signed)
Fred Bellows, MD [address] [place], [place], [place], [address] [address], [place], [place], [place], [address] Phone: [phone]  Fax: [phone]  Primary Care Physician:  Kendell Bane, NP   Pre-Procedure History & Physical: HPI:  Fred Reeves is a 59 y.o. male is here for an colonoscopy.   History reviewed. No pertinent past medical history.  Past Surgical History:  Procedure Laterality Date  . COLONOSCOPY    . COLONOSCOPY WITH PROPOFOL N/A 04/10/2020   Procedure: COLONOSCOPY WITH PROPOFOL;  Surgeon: Fred Bellows, MD;  Location: [facility] ENDOSCOPY;  Service: Gastroenterology;  Laterality: N/A;    Prior to Admission medications   Medication Sig Start Date End Date Taking? Authorizing Provider  Glucosamine HCl (GLUCOSAMINE PO) Take by mouth.   Yes [provider]  pantoprazole (PROTONIX) 40 MG tablet Take by mouth.   Yes [provider]  rosuvastatin (CRESTOR) 40 MG tablet Take 40 mg by mouth daily.   Yes [provider]  cephALEXin (KEFLEX) 500 MG capsule Keflex 500 mg capsule  Take 1 capsule every 6 hours by oral route for 5 days. Patient not taking: Reported on 03/16/2020    [provider]  Cholecalciferol 25 MCG (1000 UT) tablet Take by mouth. Patient not taking: Reported on 03/16/2020    [provider]  sildenafil (REVATIO) 20 MG tablet Take by mouth. 11/09/18   [provider]  sildenafil (VIAGRA) 100 MG tablet Take 100 mg by mouth daily as needed for erectile dysfunction.    [provider]  tadalafil (CIALIS) 10 MG tablet Take 1 tablet (10 mg total) by mouth daily as needed for erectile dysfunction. 03/06/20   Kendell Bane, NP    Allergies as of 03/16/2020  . (No Known Allergies)    Family History  Problem Relation Age of Onset  . Stroke Mother   . Aneurysm Brother     Social History   Socioeconomic History  . Marital status: Married    Spouse name: Not on file  . Number of  children: Not on file  . Years of education: Not on file  . Highest education level: Not on file  Occupational History  . Not on file  Tobacco Use  . Smoking status: Former Smoker    Types: Cigarettes  . Smokeless tobacco: Never Used  Substance and Sexual Activity  . Alcohol use: Yes    Comment: moderate  . Drug use: Never  . Sexual activity: Not on file  Other Topics Concern  . Not on file  Social History Narrative  . Not on file   Social Determinants of Health   Financial Resource Strain:   . Difficulty of Paying Living Expenses:   Food Insecurity:   . Worried About Charity fundraiser in the Last Year:   . Arboriculturist in the Last Year:   Transportation Needs:   . Film/video editor (Medical):   Marland Kitchen Lack of Transportation (Non-Medical):   Physical Activity:   . Days of Exercise per Week:   . Minutes of Exercise per Session:   Stress:   . Feeling of Stress :   Social Connections:   . Frequency of Communication with Friends and Family:   . Frequency of Social Gatherings with Friends and Family:   . Attends Religious Services:   . Active Member of Clubs or Organizations:   . Attends Archivist Meetings:   Marland Kitchen Marital Status:   Intimate Partner Violence:   .  Fear of Current or Ex-Partner:   . Emotionally Abused:   Marland Kitchen Physically Abused:   . Sexually Abused:     Review of Systems: See HPI, otherwise negative ROS  Physical Exam: BP 103/74   Pulse 60   Temp (!) 97 F (36.1 C)   Resp 18   Ht 5' 8.5" (1.74 m)   Wt 94.8 kg   SpO2 100%   BMI 31.32 kg/m  General:   Alert,  pleasant and cooperative in NAD Head:  Normocephalic and atraumatic. Neck:  Supple; no masses or thyromegaly. Lungs:  Clear throughout to auscultation, normal respiratory effort.    Heart:  +S1, +S2, Regular rate and rhythm, No edema. Abdomen:  Soft, nontender and nondistended. Normal bowel sounds, without guarding, and without rebound.   Neurologic:  Alert and  oriented x4;   grossly normal neurologically.  Impression/Plan: Fred Reeves is here for an colonoscopy to be performed for Screening colonoscopy average risk   Risks, benefits, limitations, and alternatives regarding  colonoscopy have been reviewed with the patient.  Questions have been answered.  All parties agreeable.   Fred Bellows, MD  04/11/2020, 12:19 PM

## 2020-04-12 ENCOUNTER — Encounter: Payer: Self-pay | Admitting: Adult Health

## 2020-04-12 ENCOUNTER — Ambulatory Visit: Payer: BC Managed Care – PPO | Admitting: Adult Health

## 2020-04-12 ENCOUNTER — Other Ambulatory Visit: Payer: Self-pay

## 2020-04-12 ENCOUNTER — Encounter: Payer: Self-pay | Admitting: Gastroenterology

## 2020-04-12 VITALS — BP 137/77 | HR 72 | Temp 97.3°F | Resp 16 | Ht 68.5 in | Wt 216.2 lb

## 2020-04-12 DIAGNOSIS — E7849 Other hyperlipidemia: Secondary | ICD-10-CM | POA: Diagnosis not present

## 2020-04-12 DIAGNOSIS — N529 Male erectile dysfunction, unspecified: Secondary | ICD-10-CM

## 2020-04-12 DIAGNOSIS — R0683 Snoring: Secondary | ICD-10-CM

## 2020-04-12 MED ORDER — TADALAFIL 10 MG PO TABS: 10.0000 mg | ORAL_TABLET | Freq: Every day | ORAL | 0 refills | Status: DC | PRN

## 2020-04-12 NOTE — Progress Notes (Signed)
[facility] [place], [place] [address]  Internal MEDICINE  Office Visit Note  Patient Name: Fred Reeves  [id number]  [id number]  Date of Service: 04/12/2020  Chief Complaint  Patient presents with  . Follow-up    review echo    HPI  Pt is here for follow up on echo. His echo shows some diastolic dysfunction.  We discussed his sleeping, and he reports loud snoring as well as daytime fatigue. He does continue to report some ED, and would like to try Cialis.     Current Medication: Outpatient Encounter Medications as of 04/12/2020  Medication Sig  . Glucosamine HCl (GLUCOSAMINE PO) Take by mouth.  . pantoprazole (PROTONIX) 40 MG tablet Take by mouth.  . rosuvastatin (CRESTOR) 40 MG tablet Take 40 mg by mouth daily.  . sildenafil (REVATIO) 20 MG tablet Take by mouth.  . sildenafil (VIAGRA) 100 MG tablet Take 100 mg by mouth daily as needed for erectile dysfunction.  . tadalafil (CIALIS) 10 MG tablet Take 1 tablet (10 mg total) by mouth daily as needed for erectile dysfunction.  . [DISCONTINUED] cephALEXin (KEFLEX) 500 MG capsule Keflex 500 mg capsule  Take 1 capsule every 6 hours by oral route for 5 days. (Patient not taking: Reported on 03/16/2020)  . [DISCONTINUED] Cholecalciferol 25 MCG (1000 UT) tablet Take by mouth. (Patient not taking: Reported on 03/16/2020)   No facility-administered encounter medications on file as of 04/12/2020.    Surgical History: Past Surgical History:  Procedure Laterality Date  . COLONOSCOPY    . COLONOSCOPY WITH PROPOFOL N/A 04/10/2020   Procedure: COLONOSCOPY WITH PROPOFOL;  Surgeon: [name], MD;  Location: [facility] ENDOSCOPY;  Service: Gastroenterology;  Laterality: N/A;    Medical History: History reviewed. No pertinent past medical history.  Family History: Family History  Problem Relation Age of Onset  . Stroke Mother   . Aneurysm Brother     Social History   Socioeconomic History  . Marital status:  Married    Spouse name: Not on file  . Number of children: Not on file  . Years of education: Not on file  . Highest education level: Not on file  Occupational History  . Not on file  Tobacco Use  . Smoking status: Former Smoker    Types: Cigarettes  . Smokeless tobacco: Never Used  Substance and Sexual Activity  . Alcohol use: Yes    Comment: moderate  . Drug use: Never  . Sexual activity: Not on file  Other Topics Concern  . Not on file  Social History Narrative  . Not on file   Social Determinants of Health   Financial Resource Strain:   . Difficulty of Paying Living Expenses:   Food Insecurity:   . Worried About Charity fundraiser in the Last Year:   . Arboriculturist in the Last Year:   Transportation Needs:   . Film/video editor (Medical):   Marland Kitchen Lack of Transportation (Non-Medical):   Physical Activity:   . Days of Exercise per Week:   . Minutes of Exercise per Session:   Stress:   . Feeling of Stress :   Social Connections:   . Frequency of Communication with Friends and Family:   . Frequency of Social Gatherings with Friends and Family:   . Attends Religious Services:   . Active Member of Clubs or Organizations:   . Attends Archivist Meetings:   Marland Kitchen Marital Status:   Intimate Partner Violence:   .  Fear of Current or Ex-Partner:   . Emotionally Abused:   Marland Kitchen Physically Abused:   . Sexually Abused:       Review of Systems  Constitutional: Negative.  Negative for chills, fatigue and unexpected weight change.  HENT: Negative.  Negative for congestion, rhinorrhea, sneezing and sore throat.   Eyes: Negative for redness.  Respiratory: Negative.  Negative for cough, chest tightness and shortness of breath.   Cardiovascular: Negative.  Negative for chest pain and palpitations.  Gastrointestinal: Negative.  Negative for abdominal pain, constipation, diarrhea, nausea and vomiting.  Endocrine: Negative.   Genitourinary: Negative.  Negative for  dysuria and frequency.  Musculoskeletal: Negative.  Negative for arthralgias, back pain, joint swelling and neck pain.  Skin: Negative.  Negative for rash.  Allergic/Immunologic: Negative.   Neurological: Negative.  Negative for tremors and numbness.  Hematological: Negative for adenopathy. Does not bruise/bleed easily.  Psychiatric/Behavioral: Negative.  Negative for behavioral problems, sleep disturbance and suicidal ideas. The patient is not nervous/anxious.     Vital Signs: BP 137/77   Pulse 72   Temp (!) 97.3 F (36.3 C)   Resp 16   Ht 5' 8.5" (1.74 m)   Wt 216 lb 3.2 oz (98.1 kg)   SpO2 98%   BMI 32.39 kg/m    Physical Exam Vitals and nursing note reviewed.  Constitutional:      General: He is not in acute distress.    Appearance: He is well-developed. He is not diaphoretic.  HENT:     Head: Normocephalic and atraumatic.     Mouth/Throat:     Pharynx: No oropharyngeal exudate.  Eyes:     Pupils: Pupils are equal, round, and reactive to light.  Neck:     Thyroid: No thyromegaly.     Vascular: No JVD.     Trachea: No tracheal deviation.  Cardiovascular:     Rate and Rhythm: Normal rate and regular rhythm.     Heart sounds: Normal heart sounds. No murmur heard.  No friction rub. No gallop.   Pulmonary:     Effort: Pulmonary effort is normal. No respiratory distress.     Breath sounds: Normal breath sounds. No wheezing or rales.  Chest:     Chest wall: No tenderness.  Abdominal:     Palpations: Abdomen is soft.     Tenderness: There is no abdominal tenderness. There is no guarding.  Musculoskeletal:        General: Normal range of motion.     Cervical back: Normal range of motion and neck supple.  Lymphadenopathy:     Cervical: No cervical adenopathy.  Skin:    General: Skin is warm and dry.  Neurological:     Mental Status: He is alert and oriented to person, place, and time.     Cranial Nerves: No cranial nerve deficit.  Psychiatric:        Behavior:  Behavior normal.        Thought Content: Thought content normal.        Judgment: Judgment normal.     Assessment/Plan: 1. Loud snoring Have home sleep study to assess for OSA.  - Home sleep test  2. Erectile dysfunction, unspecified erectile dysfunction type Discussed side effects and side effects  to watch for.   - tadalafil (CIALIS) 10 MG tablet; Take 1 tablet (10 mg total) by mouth daily as needed for erectile dysfunction.  Dispense: 30 tablet; Refill: 0  3. Other hyperlipidemia Continue to monitor.  General Counseling: Merry Proud  verbalizes understanding of the findings of todays visit and agrees with plan of treatment. I have discussed any further diagnostic evaluation that may be needed or ordered today. We also reviewed his medications today. he has been encouraged to call the office with any questions or concerns that should arise related to todays visit.    Orders Placed This Encounter  Procedures  . Home sleep test    No orders of the defined types were placed in this encounter.   Time spent: 30  Minutes   This patient was seen by Orson Gear AGNP-C in Collaboration with Dr Lavera Guise as a part of collaborative care agreement     Kendell Bane AGNP-C Internal medicine

## 2020-04-26 ENCOUNTER — Telehealth: Payer: Self-pay | Admitting: Internal Medicine

## 2020-09-11 ENCOUNTER — Telehealth: Payer: Self-pay

## 2020-09-12 ENCOUNTER — Other Ambulatory Visit: Payer: Self-pay

## 2020-09-12 DIAGNOSIS — N529 Male erectile dysfunction, unspecified: Secondary | ICD-10-CM

## 2020-09-12 MED ORDER — TADALAFIL 10 MG PO TABS: 10.0000 mg | ORAL_TABLET | Freq: Every day | ORAL | 0 refills | Status: DC | PRN

## 2020-09-12 NOTE — Telephone Encounter (Signed)
Lmom thart we send med only 15 tab as per taylor

## 2020-09-13 NOTE — Telephone Encounter (Signed)
Will discuss at next appointment about re ordering with [facility]. Fred Reeves

## 2020-09-17 IMAGING — CR DG CHEST 2V
1 series · 2 of 2 positions shown · non-contrast
Comparison: No priors.

CLINICAL DATA: 59-year-old male with history of cough and dyspnea
is on exertion.

EXAM:
CHEST - 2 VIEW

[Series 1: dg chest 2 view · 0.14mm/px · 2 of 2 slices shown]
[im 1/2]
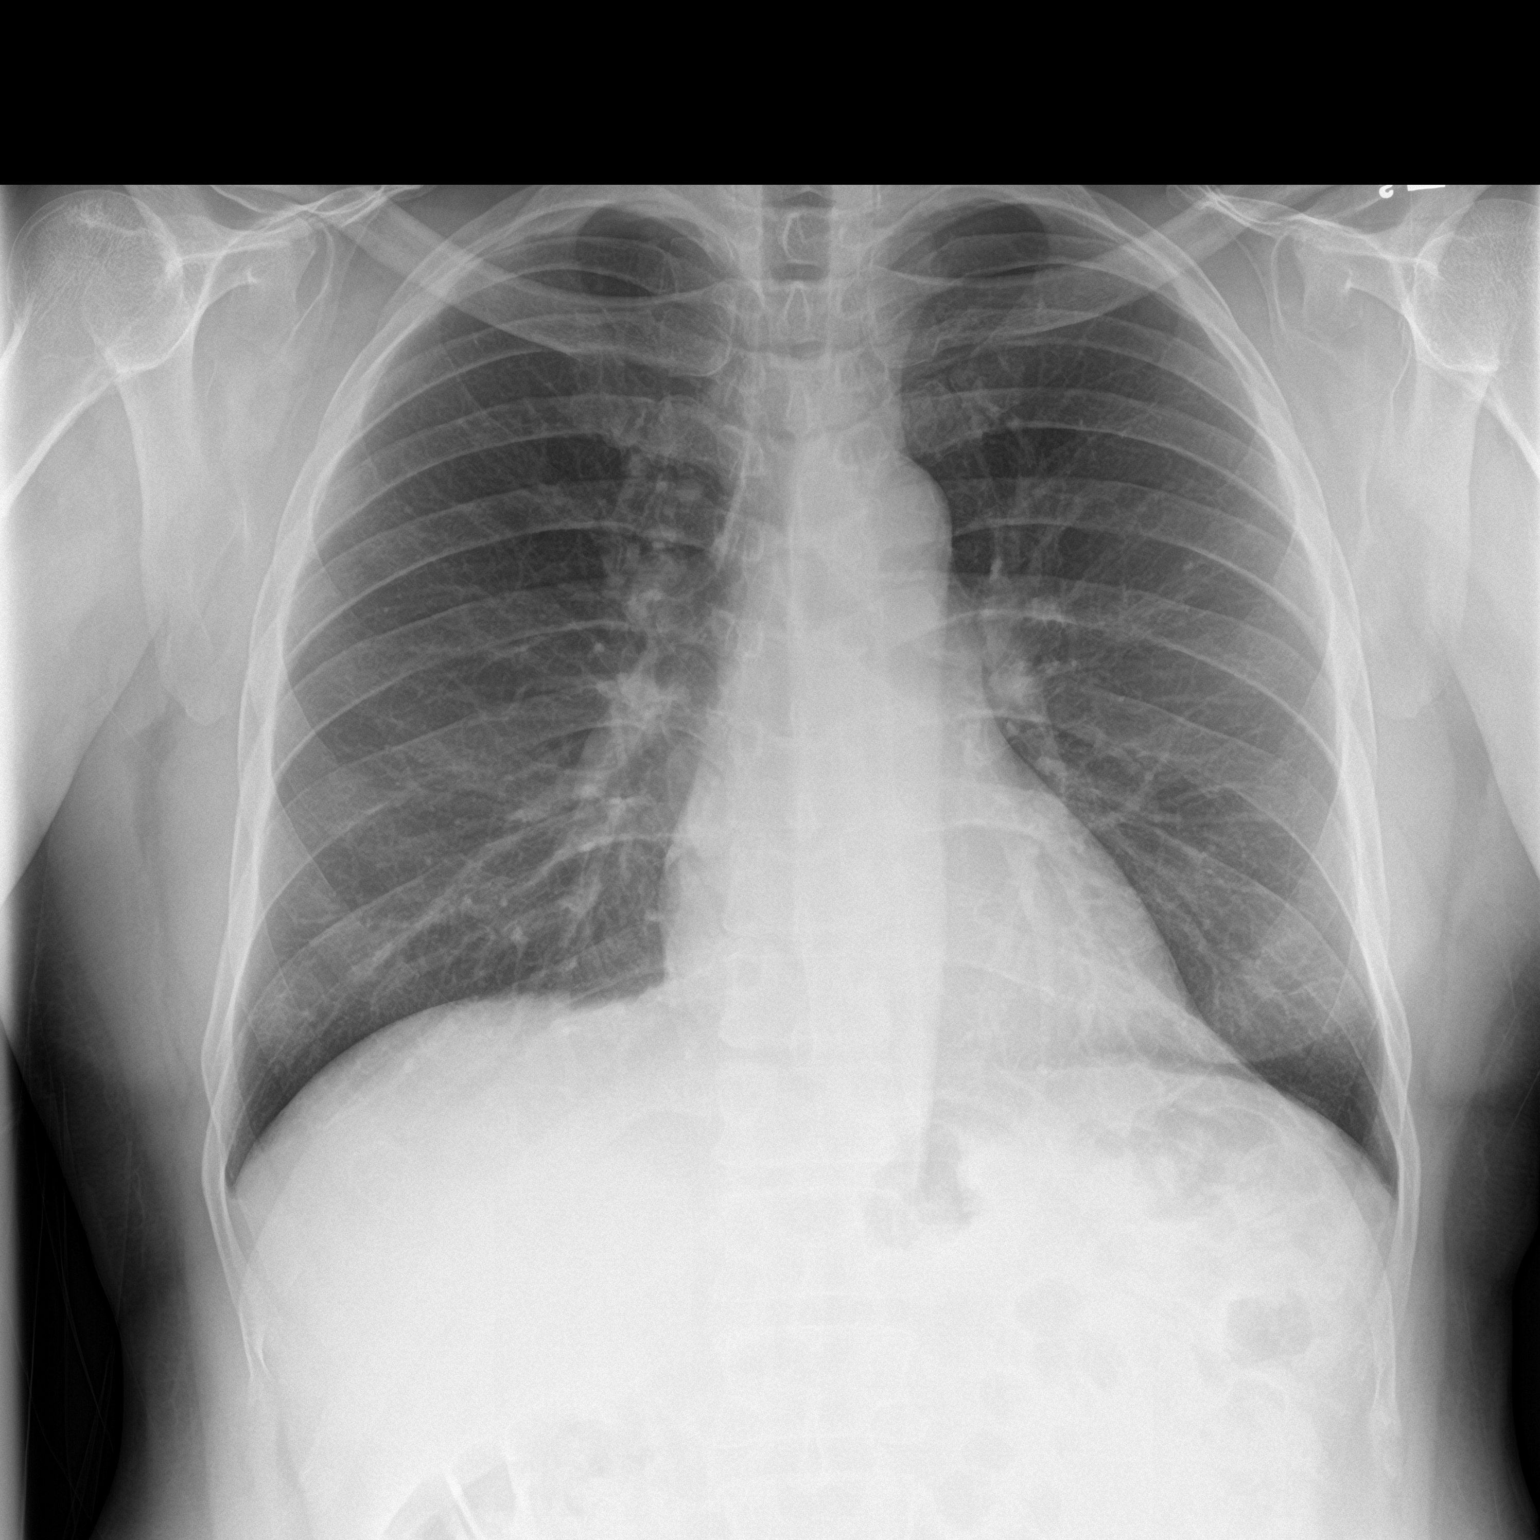
[im 2/2]
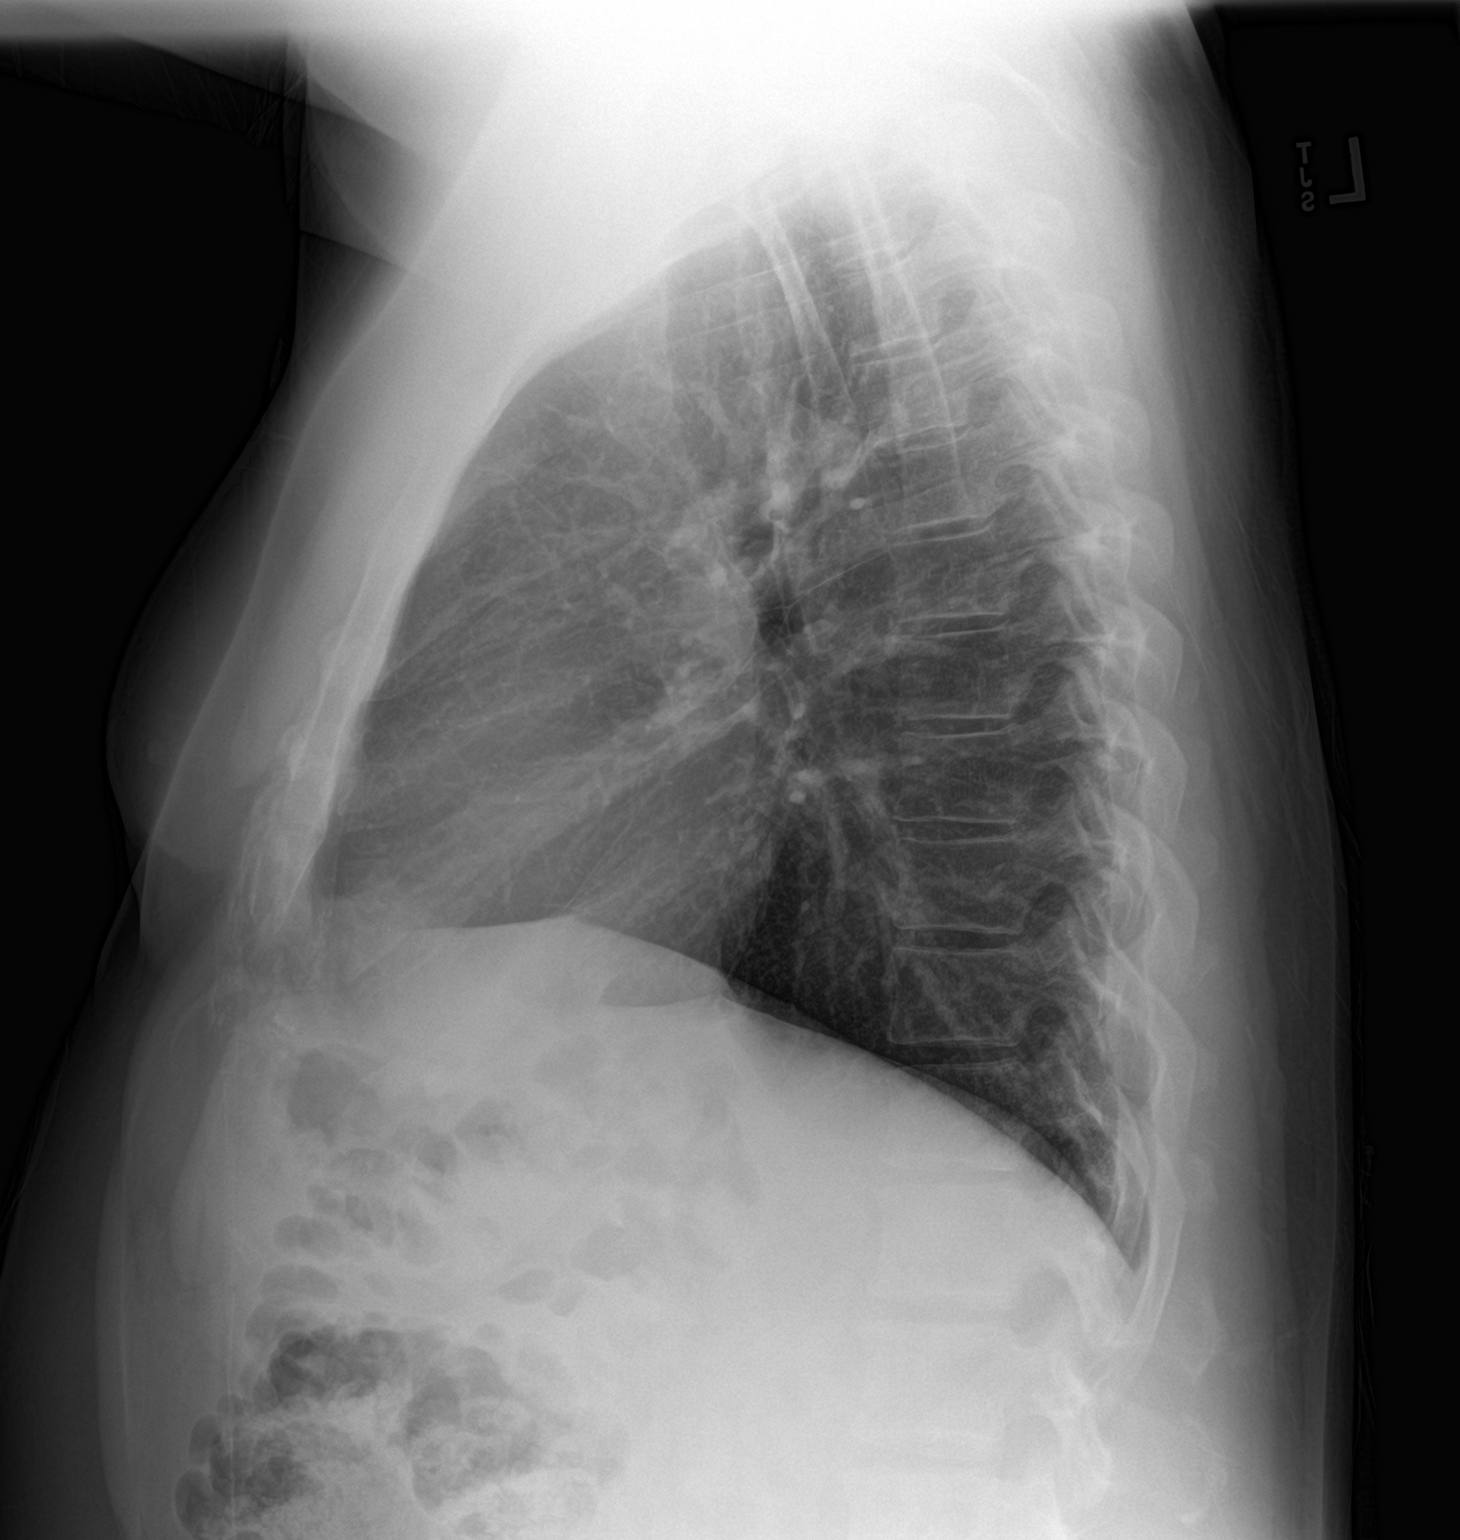

[2 of 2 positions shown; findings below may reference images not displayed]

FINDINGS: Lung volumes are normal. No consolidative airspace disease. No
pleural effusions. No pneumothorax. No pulmonary nodule or mass
noted. Pulmonary vasculature and the cardiomediastinal silhouette
are within normal limits.
IMPRESSION: No radiographic evidence of acute cardiopulmonary disease.

## 2020-10-02 ENCOUNTER — Ambulatory Visit: Payer: BC Managed Care – PPO | Admitting: Hospice and Palliative Medicine

## 2020-10-02 ENCOUNTER — Other Ambulatory Visit: Payer: Self-pay

## 2020-10-02 ENCOUNTER — Encounter: Payer: Self-pay | Admitting: Hospice and Palliative Medicine

## 2020-10-02 VITALS — BP 142/87 | HR 72 | Temp 97.8°F | Resp 16 | Ht 70.0 in | Wt 225.0 lb

## 2020-10-02 DIAGNOSIS — R252 Cramp and spasm: Secondary | ICD-10-CM

## 2020-10-02 DIAGNOSIS — E782 Mixed hyperlipidemia: Secondary | ICD-10-CM | POA: Diagnosis not present

## 2020-10-02 DIAGNOSIS — I10 Essential (primary) hypertension: Secondary | ICD-10-CM | POA: Diagnosis not present

## 2020-10-02 DIAGNOSIS — N529 Male erectile dysfunction, unspecified: Secondary | ICD-10-CM | POA: Diagnosis not present

## 2020-10-02 MED ORDER — ENALAPRIL MALEATE 5 MG PO TABS: 5.0000 mg | ORAL_TABLET | Freq: Every day | ORAL | 0 refills | Status: DC

## 2020-10-02 MED ORDER — ROSUVASTATIN CALCIUM 10 MG PO TABS: 10.0000 mg | ORAL_TABLET | Freq: Every day | ORAL | 3 refills | Status: DC

## 2020-10-02 MED ORDER — TADALAFIL 10 MG PO TABS: 10.0000 mg | ORAL_TABLET | Freq: Every day | ORAL | 1 refills | Status: DC | PRN

## 2020-10-02 NOTE — Progress Notes (Signed)
[facility] [place], [place] [address]  Internal MEDICINE  Office Visit Note  Patient Name: Fred Reeves  [id number]  [id number]  Date of Service: 10/03/2020  Chief Complaint  Patient presents with  . Hypertension  . Cramps    Both thigh   . Hyperlipidemia    HPI Patient is here today for routine follow-up Elevation in BP prompted him to make today's visit Wife had a heart attack a few months ago and he would like to have better control of his heart health Denies headaches, visual disturbances or chest pain  Continue to complain of bilateral thigh cramping at night, happens maybe once a week every 3 weeks, unsure as to what is causing cramping  Sleep study was ordered at last visit, he declined set up as he did not see the need and due to financial concerns  Also requesting refills of tadalafils   Current Medication: Outpatient Encounter Medications as of 10/02/2020  Medication Sig  . enalapril (VASOTEC) 5 MG tablet Take 1 tablet (5 mg total) by mouth daily.  . Glucosamine HCl (GLUCOSAMINE PO) Take by mouth.  . pantoprazole (PROTONIX) 40 MG tablet Take by mouth.  . rosuvastatin (CRESTOR) 10 MG tablet Take 1 tablet (10 mg total) by mouth daily.  . [DISCONTINUED] rosuvastatin (CRESTOR) 40 MG tablet Take 40 mg by mouth daily.  . [DISCONTINUED] tadalafil (CIALIS) 10 MG tablet Take 1 tablet (10 mg total) by mouth daily as needed for erectile dysfunction.  . tadalafil (CIALIS) 10 MG tablet Take 1 tablet (10 mg total) by mouth daily as needed for erectile dysfunction.   No facility-administered encounter medications on file as of 10/02/2020.    Surgical History: Past Surgical History:  Procedure Laterality Date  . COLONOSCOPY    . COLONOSCOPY WITH PROPOFOL N/A 04/10/2020   Procedure: COLONOSCOPY WITH PROPOFOL;  Surgeon: [name], MD;  Location: [facility] ENDOSCOPY;  Service: Gastroenterology;  Laterality: N/A;    Medical History: History reviewed. No  pertinent past medical history.  Family History: Family History  Problem Relation Age of Onset  . Stroke Mother   . Aneurysm Brother     Social History   Socioeconomic History  . Marital status: Married    Spouse name: Not on file  . Number of children: Not on file  . Years of education: Not on file  . Highest education level: Not on file  Occupational History  . Not on file  Tobacco Use  . Smoking status: Former Smoker    Types: Cigarettes  . Smokeless tobacco: Never Used  Substance and Sexual Activity  . Alcohol use: Yes    Comment: moderate  . Drug use: Never  . Sexual activity: Not on file  Other Topics Concern  . Not on file  Social History Narrative  . Not on file   Social Determinants of Health   Financial Resource Strain: Not on file  Food Insecurity: Not on file  Transportation Needs: Not on file  Physical Activity: Not on file  Stress: Not on file  Social Connections: Not on file  Intimate Partner Violence: Not on file      Review of Systems  Constitutional: Negative for chills, fatigue and unexpected weight change.  HENT: Negative for congestion, postnasal drip, rhinorrhea, sneezing and sore throat.   Eyes: Negative for redness.  Respiratory: Negative for cough, chest tightness and shortness of breath.   Cardiovascular: Negative for chest pain and palpitations.  Gastrointestinal: Negative for abdominal pain, constipation, diarrhea,  nausea and vomiting.  Genitourinary: Negative for dysuria and frequency.  Musculoskeletal: Negative for arthralgias, back pain, joint swelling and neck pain.  Skin: Negative for rash.  Neurological: Negative for tremors and numbness.  Hematological: Negative for adenopathy. Does not bruise/bleed easily.  Psychiatric/Behavioral: Negative for behavioral problems (Depression), sleep disturbance and suicidal ideas. The patient is not nervous/anxious.     Vital Signs: BP (!) 142/87   Pulse 72   Temp 97.8 F (36.6 C)    Resp 16   Ht 5\' 10"  (1.778 m)   Wt 225 lb (102.1 kg)   SpO2 99%   BMI 32.28 kg/m    Physical Exam Vitals reviewed.  Constitutional:      Appearance: Normal appearance. He is obese.  Cardiovascular:     Rate and Rhythm: Normal rate and regular rhythm.     Pulses: Normal pulses.     Heart sounds: Normal heart sounds.  Pulmonary:     Effort: Pulmonary effort is normal.     Breath sounds: Normal breath sounds.  Musculoskeletal:        General: Normal range of motion.  Skin:    General: Skin is warm.  Neurological:     General: No focal deficit present.     Mental Status: He is alert and oriented to person, place, and time. Mental status is at baseline.  Psychiatric:        Mood and Affect: Mood normal.        Behavior: Behavior normal.        Thought Content: Thought content normal.        Judgment: Judgment normal.     Assessment/Plan: 1. Essential hypertension Start low dose enalapril due to elevated BP today and at previous visits Will update labs Discussed link between untreated OSA and HTN as well as leg cramping and ED Not convinced he needs sleep study but will contact insurance company to get estimated price of PSG and home sleep study Will discuss again at next visit - enalapril (VASOTEC) 5 MG tablet; Take 1 tablet (5 mg total) by mouth daily.  Dispense: 90 tablet; Refill: 0 - Comprehensive Metabolic Panel (CMET) - CBC w/Diff/Platelet - Lipid Panel With LDL/HDL Ratio - TSH + free T4 - Testosterone,Free and Total  2. Leg cramping Will review labs--potentially related to OSA/RLS Infrequent occurrence, will continue to monitor - Comprehensive Metabolic Panel (CMET) - CBC w/Diff/Platelet - Lipid Panel With LDL/HDL Ratio - TSH + free T4 - Testosterone,Free and Total  3. Mixed hyperlipidemia Requesting refills today Will update labs, consider further vascular studies - Comprehensive Metabolic Panel (CMET) - rosuvastatin (CRESTOR) 10 MG tablet; Take 1 tablet  (10 mg total) by mouth daily.  Dispense: 90 tablet; Refill: 3 - CBC w/Diff/Platelet - Lipid Panel With LDL/HDL Ratio - TSH + free T4 - Testosterone,Free and Total  4. Erectile dysfunction, unspecified erectile dysfunction type Requesting refills today--good response to therapy - tadalafil (CIALIS) 10 MG tablet; Take 1 tablet (10 mg total) by mouth daily as needed for erectile dysfunction.  Dispense: 90 tablet; Refill: 1  General Counseling: Jevan verbalizes understanding of the findings of todays visit and agrees with plan of treatment. I have discussed any further diagnostic evaluation that may be needed or ordered today. We also reviewed his medications today. he has been encouraged to call the office with any questions or concerns that should arise related to todays visit.  Hypertension Counseling:   The following hypertensive lifestyle modification were recommended and discussed:  1.  Limiting alcohol intake to less than 1 oz/day of ethanol:(24 oz of beer or 8 oz of wine or 2 oz of 100-proof whiskey). 2. Take baby ASA 81 mg daily. 3. Importance of regular aerobic exercise and losing weight. 4. Reduce dietary saturated fat and cholesterol intake for overall cardiovascular health. 5. Maintaining adequate dietary potassium, calcium, and magnesium intake. 6. Regular monitoring of the blood pressure. 7. Reduce sodium intake to less than 100 mmol/day (less than 2.3 gm of sodium or less than 6 gm of sodium choride)   Orders Placed This Encounter  Procedures  . Comprehensive Metabolic Panel (CMET)  . CBC w/Diff/Platelet  . Lipid Panel With LDL/HDL Ratio  . TSH + free T4  . Testosterone,Free and Total    Meds ordered this encounter  Medications  . enalapril (VASOTEC) 5 MG tablet    Sig: Take 1 tablet (5 mg total) by mouth daily.    Dispense:  90 tablet    Refill:  0  . rosuvastatin (CRESTOR) 10 MG tablet    Sig: Take 1 tablet (10 mg total) by mouth daily.    Dispense:  90 tablet     Refill:  3  . tadalafil (CIALIS) 10 MG tablet    Sig: Take 1 tablet (10 mg total) by mouth daily as needed for erectile dysfunction.    Dispense:  90 tablet    Refill:  1    Good Rx BIN [id number], PCN [id number], Group [id number], Member ID [id number]    Time spent: Time spent includes review of chart, medications, test results and follow-up plan with the patient.  This patient was seen by Leeanne Deed AGNP-C in Collaboration with Dr Lyndon Code as a part of collaborative care agreement     Lubertha Basque. Aldea Avis AGNP-C Internal medicine

## 2020-10-03 ENCOUNTER — Encounter: Payer: Self-pay | Admitting: Hospice and Palliative Medicine

## 2020-11-02 DIAGNOSIS — E782 Mixed hyperlipidemia: Secondary | ICD-10-CM | POA: Diagnosis not present

## 2020-11-02 DIAGNOSIS — R252 Cramp and spasm: Secondary | ICD-10-CM | POA: Diagnosis not present

## 2020-11-02 DIAGNOSIS — I10 Essential (primary) hypertension: Secondary | ICD-10-CM | POA: Diagnosis not present

## 2020-11-02 LAB — BASIC METABOLIC PANEL: Glucose: 102

## 2020-11-03 LAB — CBC WITH DIFFERENTIAL/PLATELET
Basophils Absolute: 0.1 10*3/uL (ref 0.0–0.2)
Basos: 1 %
EOS (ABSOLUTE): 0.1 10*3/uL (ref 0.0–0.4)
Eos: 2 %
Hematocrit: 44.6 % (ref 37.5–51.0)
Hemoglobin: 14.8 g/dL (ref 13.0–17.7)
Immature Grans (Abs): 0 10*3/uL (ref 0.0–0.1)
Immature Granulocytes: 0 %
Lymphocytes Absolute: 2.1 10*3/uL (ref 0.7–3.1)
Lymphs: 40 %
MCH: 27.9 pg (ref 26.6–33.0)
MCHC: 33.2 g/dL (ref 31.5–35.7)
MCV: 84 fL (ref 79–97)
Monocytes Absolute: 0.5 10*3/uL (ref 0.1–0.9)
Monocytes: 9 %
Neutrophils Absolute: 2.5 10*3/uL (ref 1.4–7.0)
Neutrophils: 48 %
Platelets: 234 10*3/uL (ref 150–450)
RBC: 5.3 x10E6/uL (ref 4.14–5.80)
RDW: 12.6 % (ref 11.6–15.4)
WBC: 5.2 10*3/uL (ref 3.4–10.8)

## 2020-11-03 LAB — COMPREHENSIVE METABOLIC PANEL
ALT: 10 IU/L (ref 0–44)
AST: 16 IU/L (ref 0–40)
Albumin/Globulin Ratio: 1.6 (ref 1.2–2.2)
Albumin: 4.6 g/dL (ref 3.8–4.8)
Alkaline Phosphatase: 67 IU/L (ref 44–121)
BUN/Creatinine Ratio: 14 (ref 10–24)
BUN: 16 mg/dL (ref 8–27)
Bilirubin Total: 0.3 mg/dL (ref 0.0–1.2)
CO2: 22 mmol/L (ref 20–29)
Calcium: 9.6 mg/dL (ref 8.6–10.2)
Chloride: 105 mmol/L (ref 96–106)
Creatinine, Ser: 1.15 mg/dL (ref 0.76–1.27)
GFR calc Af Amer: 79 mL/min/{1.73_m2} (ref >59)
GFR calc non Af Amer: 68 mL/min/{1.73_m2} (ref >59)
Globulin, Total: 2.8 g/dL (ref 1.5–4.5)
Glucose: 102 mg/dL — ABNORMAL HIGH (ref 65–99)
Potassium: 4.9 mmol/L (ref 3.5–5.2)
Sodium: 141 mmol/L (ref 134–144)
Total Protein: 7.4 g/dL (ref 6.0–8.5)

## 2020-11-03 LAB — TESTOSTERONE,FREE AND TOTAL
Testosterone, Free: 13.4 pg/mL (ref 6.6–18.1)
Testosterone: 224 ng/dL — ABNORMAL LOW (ref 264–916)

## 2020-11-03 LAB — LIPID PANEL WITH LDL/HDL RATIO
Cholesterol, Total: 165 mg/dL (ref 100–199)
HDL: 69 mg/dL (ref >39)
LDL Chol Calc (NIH): 83 mg/dL (ref 0–99)
LDL/HDL Ratio: 1.2 ratio (ref 0.0–3.6)
Triglycerides: 65 mg/dL (ref 0–149)
VLDL Cholesterol Cal: 13 mg/dL (ref 5–40)

## 2020-11-03 LAB — TSH+FREE T4
Free T4: 1.14 ng/dL (ref 0.82–1.77)
TSH: 1.52 u[IU]/mL (ref 0.450–4.500)

## 2020-11-05 NOTE — Progress Notes (Signed)
Labs reviewed-will discuss at upcoming visit.

## 2020-11-13 ENCOUNTER — Other Ambulatory Visit: Payer: Self-pay

## 2020-11-13 ENCOUNTER — Encounter: Payer: Self-pay | Admitting: Hospice and Palliative Medicine

## 2020-11-13 ENCOUNTER — Ambulatory Visit: Payer: BC Managed Care – PPO | Admitting: Hospice and Palliative Medicine

## 2020-11-13 VITALS — BP 122/76 | HR 75 | Temp 97.1°F | Resp 16 | Ht 69.0 in | Wt 225.0 lb

## 2020-11-13 DIAGNOSIS — N529 Male erectile dysfunction, unspecified: Secondary | ICD-10-CM | POA: Diagnosis not present

## 2020-11-13 DIAGNOSIS — R7989 Other specified abnormal findings of blood chemistry: Secondary | ICD-10-CM

## 2020-11-13 DIAGNOSIS — I10 Essential (primary) hypertension: Secondary | ICD-10-CM | POA: Diagnosis not present

## 2020-11-13 DIAGNOSIS — R7301 Impaired fasting glucose: Secondary | ICD-10-CM

## 2020-11-13 DIAGNOSIS — R7303 Prediabetes: Secondary | ICD-10-CM

## 2020-11-13 LAB — POCT GLYCOSYLATED HEMOGLOBIN (HGB A1C): Hemoglobin A1C: 5.7 % — AB (ref 4.0–5.6)

## 2020-11-13 MED ORDER — ENALAPRIL MALEATE 5 MG PO TABS: 5.0000 mg | ORAL_TABLET | Freq: Every day | ORAL | 1 refills | Status: DC

## 2020-11-13 NOTE — Progress Notes (Signed)
[facility] [place], [place] [address]  Internal MEDICINE  Office Visit Note  Patient Name: Fred Reeves  [id number]  [id number]  Date of Service: 11/17/2020  Chief Complaint  Patient presents with  . Follow-up    Review lab results, sleep apnea test     HPI Patient is here for routine follow-up Reviewed recent labs--slightly elevated glucose level, low testosterone levels Continues to have issues with erectile dysfunction--has taken Cialis for some time, over the last several months he has noticed Cialis is not as helpful as it was in the past Concerned about elevated glucose levels and would like A1C testing BP readings from home have been well controlled Has not had sleep study yet, still wanting to contact insurance company to check on pricing before agreeing to have study ordered  Current Medication: Outpatient Encounter Medications as of 11/13/2020  Medication Sig  . Glucosamine HCl (GLUCOSAMINE PO) Take by mouth.  . pantoprazole (PROTONIX) 40 MG tablet Take by mouth.  . rosuvastatin (CRESTOR) 10 MG tablet Take 1 tablet (10 mg total) by mouth daily.  . tadalafil (CIALIS) 10 MG tablet Take 1 tablet (10 mg total) by mouth daily as needed for erectile dysfunction.  . [DISCONTINUED] enalapril (VASOTEC) 5 MG tablet Take 1 tablet (5 mg total) by mouth daily.  . enalapril (VASOTEC) 5 MG tablet Take 1 tablet (5 mg total) by mouth daily.   No facility-administered encounter medications on file as of 11/13/2020.    Surgical History: Past Surgical History:  Procedure Laterality Date  . COLONOSCOPY    . COLONOSCOPY WITH PROPOFOL N/A 04/10/2020   Procedure: COLONOSCOPY WITH PROPOFOL;  Surgeon: [name], MD;  Location: [facility] ENDOSCOPY;  Service: Gastroenterology;  Laterality: N/A;    Medical History: History reviewed. No pertinent past medical history.  Family History: Family History  Problem Relation Age of Onset  . Stroke Mother   . Aneurysm  Brother     Social History   Socioeconomic History  . Marital status: Married    Spouse name: Not on file  . Number of children: Not on file  . Years of education: Not on file  . Highest education level: Not on file  Occupational History  . Not on file  Tobacco Use  . Smoking status: Former Smoker    Types: Cigarettes  . Smokeless tobacco: Never Used  Substance and Sexual Activity  . Alcohol use: Yes    Comment: moderate  . Drug use: Never  . Sexual activity: Not on file  Other Topics Concern  . Not on file  Social History Narrative  . Not on file   Social Determinants of Health   Financial Resource Strain: Not on file  Food Insecurity: Not on file  Transportation Needs: Not on file  Physical Activity: Not on file  Stress: Not on file  Social Connections: Not on file  Intimate Partner Violence: Not on file      Review of Systems  Constitutional: Negative for chills, fatigue and unexpected weight change.  HENT: Negative for congestion, postnasal drip, rhinorrhea, sneezing and sore throat.   Eyes: Negative for redness.  Respiratory: Negative for cough, chest tightness and shortness of breath.   Cardiovascular: Negative for chest pain and palpitations.  Gastrointestinal: Negative for abdominal pain, constipation, diarrhea, nausea and vomiting.  Genitourinary: Negative for dysuria and frequency.  Musculoskeletal: Negative for arthralgias, back pain, joint swelling and neck pain.  Skin: Negative for rash.  Neurological: Negative for tremors and numbness.  Hematological: Negative for adenopathy. Does not bruise/bleed easily.  Psychiatric/Behavioral: Negative for behavioral problems (Depression), sleep disturbance and suicidal ideas. The patient is not nervous/anxious.     Vital Signs: BP 122/76   Pulse 75   Temp (!) 97.1 F (36.2 C)   Resp 16   Ht 5\' 9"  (1.753 m)   Wt 225 lb (102.1 kg)   SpO2 98%   BMI 33.23 kg/m    Physical Exam Vitals reviewed.   Constitutional:      Appearance: Normal appearance. He is normal weight.  Cardiovascular:     Rate and Rhythm: Normal rate and regular rhythm.     Pulses: Normal pulses.     Heart sounds: Normal heart sounds.  Pulmonary:     Effort: Pulmonary effort is normal.     Breath sounds: Normal breath sounds.  Abdominal:     General: Abdomen is flat.     Palpations: Abdomen is soft.  Musculoskeletal:        General: Normal range of motion.     Cervical back: Normal range of motion.  Skin:    General: Skin is warm.  Neurological:     General: No focal deficit present.     Mental Status: He is alert and oriented to person, place, and time. Mental status is at baseline.  Psychiatric:        Mood and Affect: Mood normal.        Behavior: Behavior normal.        Thought Content: Thought content normal.        Judgment: Judgment normal.    Assessment/Plan: 1. Essential hypertension BP and HR well controlled on current therapy, requesting refills - enalapril (VASOTEC) 5 MG tablet; Take 1 tablet (5 mg total) by mouth daily.  Dispense: 90 tablet; Refill: 1  2. Low testosterone Symptomatic, risk for HRT due to HTN - Ambulatory referral to Urology  3. Erectile dysfunction, unspecified erectile dysfunction type Continue with Cialis and discuss other treatment options with urology  4. Impaired fasting glucose - POCT HgB A1C  5. Pre-diabetes A1C 5.7--not interested in medication therapy In depth discussions about lifestyle modifications to control glucose levels Carbs and sugar in moderation and increasing physical activity--will closely monitor levels  General Counseling: Isaul verbalizes understanding of the findings of todays visit and agrees with plan of treatment. I have discussed any further diagnostic evaluation that may be needed or ordered today. We also reviewed his medications today. he has been encouraged to call the office with any questions or concerns that should arise related  to todays visit.    Orders Placed This Encounter  Procedures  . Ambulatory referral to Urology  . POCT HgB A1C    Meds ordered this encounter  Medications  . enalapril (VASOTEC) 5 MG tablet    Sig: Take 1 tablet (5 mg total) by mouth daily.    Dispense:  90 tablet    Refill:  1    Time spent: 30 Minutes Time spent includes review of chart, medications, test results and follow-up plan with the patient.  This patient was seen by Theodoro Grist AGNP-C in Collaboration with Dr Lavera Guise as a part of collaborative care agreement     Tanna Furry. Dajohn Ellender AGNP-C Internal medicine

## 2020-11-17 ENCOUNTER — Encounter: Payer: Self-pay | Admitting: Hospice and Palliative Medicine

## 2020-12-05 ENCOUNTER — Encounter: Payer: Self-pay | Admitting: Urology

## 2020-12-05 ENCOUNTER — Other Ambulatory Visit: Payer: Self-pay

## 2020-12-05 ENCOUNTER — Ambulatory Visit: Payer: BC Managed Care – PPO | Admitting: Urology

## 2020-12-05 VITALS — BP 147/79 | HR 71 | Ht 69.0 in | Wt 215.0 lb

## 2020-12-05 DIAGNOSIS — N529 Male erectile dysfunction, unspecified: Secondary | ICD-10-CM | POA: Diagnosis not present

## 2020-12-05 DIAGNOSIS — E349 Endocrine disorder, unspecified: Secondary | ICD-10-CM

## 2020-12-05 DIAGNOSIS — E291 Testicular hypofunction: Secondary | ICD-10-CM

## 2020-12-05 NOTE — Progress Notes (Signed)
12/05/2020 3:46 PM   Barth Kirks Jan 29, 1961 [id number]  Referring provider: Luiz Ochoa, NP [place],  [place] [address]  Chief Complaint  Patient presents with  . Hypogonadism    HPI: Fred Reeves is a 60 y.o. male referred for evaluation of hypogonadism.   Several year history of mild ED previously on PDE 5 inhibitor as needed  Worsening ED over the past year with difficulty achieving an erection  Presently using tadalafil 10 mg which recently has not been as effective as previously  Does note tiredness, fatigue.  Libido okay but not as good as in previous years  Was on TRT 5-6 years ago; states he discontinued after levels improved and stayed normal  Total testosterone level drawn 11/02/2020 was low at 224 ng/dL; a free testosterone level was also checked which was normal at 13.4 pg/mL  No bothersome LUTS   Last PSA 03/09/2020 was 1.0   PMH: History reviewed. No pertinent past medical history.  Surgical History: Past Surgical History:  Procedure Laterality Date  . COLONOSCOPY    . COLONOSCOPY WITH PROPOFOL N/A 04/10/2020   Procedure: COLONOSCOPY WITH PROPOFOL;  Surgeon: [name], MD;  Location: [facility] ENDOSCOPY;  Service: Gastroenterology;  Laterality: N/A;    Home Medications:  Allergies as of 12/05/2020   No Known Allergies     Medication List       Accurate as of December 05, 2020  3:46 PM. If you have any questions, ask your nurse or doctor.        enalapril 5 MG tablet Commonly known as: Vasotec Take 1 tablet (5 mg total) by mouth daily.   GLUCOSAMINE PO Take by mouth.   pantoprazole 40 MG tablet Commonly known as: PROTONIX Take by mouth.   rosuvastatin 10 MG tablet Commonly known as: Crestor Take 1 tablet (10 mg total) by mouth daily.   tadalafil 10 MG tablet Commonly known as: Cialis Take 1 tablet (10 mg total) by mouth daily as needed for erectile dysfunction.       Allergies: No Known Allergies  Family  History: Family History  Problem Relation Age of Onset  . Stroke Mother   . Aneurysm Brother     Social History:  reports that he has quit smoking. His smoking use included cigarettes. He has never used smokeless tobacco. He reports current alcohol use. He reports that he does not use drugs.   Physical Exam: BP (!) 147/79   Pulse 71   Ht 5\' 9"  (1.753 m)   Wt 215 lb (97.5 kg)   BMI 31.75 kg/m   Constitutional:  Alert and oriented, No acute distress. HEENT: [place] AT, moist mucus membranes.  Trachea midline, no masses. Cardiovascular: No clubbing, cyanosis, or edema. Respiratory: Normal respiratory effort, no increased work of breathing. GI: Abdomen is soft, nontender, nondistended, no abdominal masses GU: Phallus without lesions, testes descended bilateral without masses or tenderness.  Estimated volume ~ 20 cc bilaterally Skin: No rashes, bruises or suspicious lesions. Neurologic: Grossly intact, no focal deficits, moving all 4 extremities. Psychiatric: Normal mood and affect.  Laboratory Data:  Lab Results  Component Value Date   TESTOSTERONE 224 (L) 11/02/2020    Assessment & Plan:    1.  Hypogonadism  Recent total testosterone level was low however free testosterone levels are normal.  We did discuss that the active form of testosterone is the free testosterone.  He did inquire about ways to increase testosterone naturally and we discussed that weight training has  been found to be of benefit  We discussed to establish a diagnosis hypogonadism 2 AM low levels are needed and typically required by insurance companies for testosterone will be covered.  He will follow-up for an a.m. lab visit for testosterone, LH and prolactin    Abbie Sons, MD  [facility] [address] [place], [place] [address] [phone]

## 2020-12-06 ENCOUNTER — Encounter: Payer: Self-pay | Admitting: Urology

## 2020-12-10 ENCOUNTER — Other Ambulatory Visit: Payer: BC Managed Care – PPO

## 2020-12-10 ENCOUNTER — Other Ambulatory Visit: Payer: Self-pay

## 2020-12-10 DIAGNOSIS — E349 Endocrine disorder, unspecified: Secondary | ICD-10-CM | POA: Diagnosis not present

## 2020-12-11 LAB — LUTEINIZING HORMONE: LH: 4.1 m[IU]/mL (ref 1.7–8.6)

## 2020-12-11 LAB — TESTOSTERONE: Testosterone: 201 ng/dL — ABNORMAL LOW (ref 264–916)

## 2020-12-11 LAB — PROLACTIN: Prolactin: 7.5 ng/mL (ref 4.0–15.2)

## 2020-12-12 ENCOUNTER — Telehealth: Payer: Self-pay | Admitting: *Deleted

## 2020-12-12 NOTE — Telephone Encounter (Signed)
-----   Message from Abbie Sons, MD sent at 12/11/2020  5:56 PM EDT ----- Repeat testosterone level low at 201.  LH and prolactin were normal.  Did he want to restart testosterone injections or a different method of testosterone replacement?  If he desires to restart injections see if he needs an appointment for injection training and if he prefers injections weekly or every 2 weeks

## 2020-12-17 ENCOUNTER — Encounter: Payer: Self-pay | Admitting: *Deleted

## 2020-12-17 NOTE — Addendum Note (Signed)
Addended by: Tommy Rainwater on: 12/17/2020 01:55 PM   Modules accepted: Orders

## 2020-12-17 NOTE — Telephone Encounter (Signed)
Sent my chart message. I have tried to call patient.

## 2020-12-17 NOTE — Telephone Encounter (Signed)
Patient notified he would like to go ahead with injection therapy. He states that he has done this previously and does not need injection training. He would like script sent into [facility]. Good Rx coupon was sent to patient via text. Needle and syringes also ordered. When should patient follow up with you after starting medication, please advise?

## 2020-12-18 MED ORDER — "SYRINGE 21G X 1-1/2"" 3 ML MISC": 1.0000 | 1 refills | Status: DC

## 2020-12-18 MED ORDER — TESTOSTERONE CYPIONATE 200 MG/ML IM SOLN: 200.0000 mg | INTRAMUSCULAR | 0 refills | Status: DC

## 2020-12-18 MED ORDER — "BD BLUNT FILL NEEDLE 18G X 1-1/2"" MISC": 1.0000 | 1 refills | Status: AC

## 2020-12-18 NOTE — Telephone Encounter (Signed)
Schedule lab visit for midcycle testosterone level ~ 6 weeks

## 2020-12-18 NOTE — Addendum Note (Signed)
Addended by: Abbie Sons on: 12/18/2020 05:26 PM   Modules accepted: Orders

## 2020-12-19 NOTE — Telephone Encounter (Signed)
Notified patient as instructed, patient pleased. Discussed follow-up appointments, patient agrees  

## 2021-01-15 ENCOUNTER — Other Ambulatory Visit: Payer: Self-pay | Admitting: *Deleted

## 2021-01-15 DIAGNOSIS — E291 Testicular hypofunction: Secondary | ICD-10-CM

## 2021-02-04 ENCOUNTER — Other Ambulatory Visit: Payer: BC Managed Care – PPO

## 2021-02-07 ENCOUNTER — Encounter: Payer: Self-pay | Admitting: Urology

## 2021-02-14 ENCOUNTER — Ambulatory Visit: Payer: BC Managed Care – PPO | Admitting: Nurse Practitioner

## 2021-03-03 DIAGNOSIS — Z20822 Contact with and (suspected) exposure to covid-19: Secondary | ICD-10-CM | POA: Diagnosis not present

## 2021-03-11 ENCOUNTER — Ambulatory Visit: Payer: BC Managed Care – PPO | Admitting: Internal Medicine

## 2021-05-02 ENCOUNTER — Encounter: Payer: BC Managed Care – PPO | Admitting: Physician Assistant

## 2021-05-12 ENCOUNTER — Encounter: Payer: Self-pay | Admitting: Internal Medicine

## 2021-05-20 ENCOUNTER — Ambulatory Visit (INDEPENDENT_AMBULATORY_CARE_PROVIDER_SITE_OTHER): Payer: BC Managed Care – PPO | Admitting: Physician Assistant

## 2021-05-20 ENCOUNTER — Other Ambulatory Visit: Payer: Self-pay

## 2021-05-20 ENCOUNTER — Encounter: Payer: Self-pay | Admitting: Physician Assistant

## 2021-05-20 DIAGNOSIS — K219 Gastro-esophageal reflux disease without esophagitis: Secondary | ICD-10-CM

## 2021-05-20 DIAGNOSIS — Z23 Encounter for immunization: Secondary | ICD-10-CM

## 2021-05-20 DIAGNOSIS — R7303 Prediabetes: Secondary | ICD-10-CM | POA: Diagnosis not present

## 2021-05-20 DIAGNOSIS — R300 Dysuria: Secondary | ICD-10-CM

## 2021-05-20 DIAGNOSIS — Z125 Encounter for screening for malignant neoplasm of prostate: Secondary | ICD-10-CM

## 2021-05-20 DIAGNOSIS — E782 Mixed hyperlipidemia: Secondary | ICD-10-CM | POA: Diagnosis not present

## 2021-05-20 DIAGNOSIS — R5383 Other fatigue: Secondary | ICD-10-CM | POA: Diagnosis not present

## 2021-05-20 DIAGNOSIS — I10 Essential (primary) hypertension: Secondary | ICD-10-CM

## 2021-05-20 DIAGNOSIS — R7989 Other specified abnormal findings of blood chemistry: Secondary | ICD-10-CM

## 2021-05-20 DIAGNOSIS — Z0001 Encounter for general adult medical examination with abnormal findings: Secondary | ICD-10-CM | POA: Diagnosis not present

## 2021-05-20 MED ORDER — ZOSTER VAC RECOMB ADJUVANTED 50 MCG/0.5ML IM SUSR: 0.5000 mL | Freq: Once | INTRAMUSCULAR | 0 refills | Status: AC

## 2021-05-20 MED ORDER — PANTOPRAZOLE SODIUM 40 MG PO TBEC: 40.0000 mg | DELAYED_RELEASE_TABLET | Freq: Every day | ORAL | 1 refills | Status: DC

## 2021-05-20 NOTE — Progress Notes (Signed)
[facility] [place], [place] [address]  Internal MEDICINE  Office Visit Note  Patient Name: Fred Reeves  [id number]  [id number]  Date of Service: 05/21/2021  Chief Complaint  Patient presents with   Annual Exam   Hypertension     HPI Pt is here for routine health maintenance examination --Dark green and then black stools for a few days a few weeks ago. And had a little burning sensation in stomach with it and took protonix which helped and all symptoms resolved. No recurrence of dark stools or any evidence of blood.  -Right side of chest sore and thinks he pulled a muscle, 2 days ago. Reproducible with right shoulder movement. Not keeping him from sleeping.  Will try resting and ice. Not bad enough that he feels he needs medication. Did discuss that I would recommend voltaren over oral NSAIDs given recent dark stools. -Cramps in legs at night from time to time -BP fluctuates at home but generally well controlled -Testosterone low and went to urology, had a life event in between and did not follow up for additional treatment. Is interested in this and will call their office for further follow up -Due for routine fasting labs -up to date on colonoscopy  Current Medication: Outpatient Encounter Medications as of 05/20/2021  Medication Sig   enalapril (VASOTEC) 5 MG tablet Take 1 tablet (5 mg total) by mouth daily.   Glucosamine HCl (GLUCOSAMINE PO) Take by mouth.   NEEDLE, DISP, 18 G (B-D BLUNT FILL NEEDLE) 18G X 1-1/2" MISC 1 each by Does not apply route every 14 (fourteen) days.   rosuvastatin (CRESTOR) 10 MG tablet Take 1 tablet (10 mg total) by mouth daily.   Syringe/Needle, Disp, (SYRINGE 3CC/21GX1-1/2") 21G X 1-1/2" 3 ML MISC 1 Syringe by Does not apply route every 14 (fourteen) days.   tadalafil (CIALIS) 10 MG tablet Take 1 tablet (10 mg total) by mouth daily as needed for erectile dysfunction.   testosterone cypionate (DEPOTESTOSTERONE CYPIONATE)  200 MG/ML injection Inject 1 mL (200 mg total) into the muscle every 14 (fourteen) days.   [DISCONTINUED] pantoprazole (PROTONIX) 40 MG tablet Take by mouth.   [DISCONTINUED] Zoster Vaccine Adjuvanted [facility]) injection Inject 0.5 mLs into the muscle once.   pantoprazole (PROTONIX) 40 MG tablet Take 1 tablet (40 mg total) by mouth daily.   [EXPIRED] Zoster Vaccine Adjuvanted [facility]) injection Inject 0.5 mLs into the muscle once for 1 dose.   No facility-administered encounter medications on file as of 05/20/2021.    Surgical History: Past Surgical History:  Procedure Laterality Date   COLONOSCOPY     COLONOSCOPY WITH PROPOFOL N/A 04/10/2020   Procedure: COLONOSCOPY WITH PROPOFOL;  Surgeon: [name], MD;  Location: [facility] ENDOSCOPY;  Service: Gastroenterology;  Laterality: N/A;    Medical History: Past Medical History:  Diagnosis Date   Hypertension     Family History: Family History  Problem Relation Age of Onset   Stroke Mother    Aneurysm Brother       Review of Systems  Constitutional:  Negative for chills, fatigue and unexpected weight change.  HENT:  Negative for congestion, postnasal drip, rhinorrhea, sneezing and sore throat.   Eyes:  Negative for redness.  Respiratory:  Negative for cough, chest tightness and shortness of breath.   Cardiovascular:  Negative for chest pain and palpitations.  Gastrointestinal:  Negative for abdominal pain, blood in stool, constipation, diarrhea, nausea and vomiting.  Genitourinary:  Negative for dysuria and frequency.  Musculoskeletal:  Positive for arthralgias and myalgias. Negative for back pain, joint swelling and neck pain.  Skin:  Negative for rash.  Neurological: Negative.  Negative for tremors, numbness and headaches.  Hematological:  Negative for adenopathy. Does not bruise/bleed easily.  Psychiatric/Behavioral:  Negative for behavioral problems (Depression), sleep disturbance and suicidal ideas. The patient is not  nervous/anxious.     Vital Signs: BP 120/80   Pulse 67   Temp 97.8 F (36.6 C)   Resp 16   Ht $R'5\' 9"'IG$  (1.753 m)   Wt 217 lb (98.4 kg)   SpO2 97%   BMI 32.05 kg/m    Physical Exam Vitals and nursing note reviewed.  Constitutional:      General: He is not in acute distress.    Appearance: He is well-developed. He is obese. He is not diaphoretic.  HENT:     Head: Normocephalic and atraumatic.     Right Ear: External ear normal.     Left Ear: External ear normal.     Nose: Nose normal.     Mouth/Throat:     Pharynx: No oropharyngeal exudate.  Eyes:     General: No scleral icterus.       Right eye: No discharge.        Left eye: No discharge.     Conjunctiva/sclera: Conjunctivae normal.     Pupils: Pupils are equal, round, and reactive to light.  Neck:     Thyroid: No thyromegaly.     Vascular: No JVD.     Trachea: No tracheal deviation.  Cardiovascular:     Rate and Rhythm: Normal rate and regular rhythm.     Heart sounds: Normal heart sounds. No murmur heard.   No friction rub. No gallop.  Pulmonary:     Effort: Pulmonary effort is normal. No respiratory distress.     Breath sounds: Normal breath sounds. No stridor. No wheezing or rales.  Chest:     Chest wall: No tenderness.  Abdominal:     General: Bowel sounds are normal. There is no distension.     Palpations: Abdomen is soft. There is no mass.     Tenderness: There is no abdominal tenderness. There is no guarding or rebound.  Musculoskeletal:        General: Tenderness present. No deformity. Normal range of motion.     Cervical back: Normal range of motion and neck supple.     Right lower leg: No edema.     Left lower leg: No edema.     Comments: Right pec tender and dull ache with movement of right shoulder  Lymphadenopathy:     Cervical: No cervical adenopathy.  Skin:    General: Skin is warm and dry.     Coloration: Skin is not pale.     Findings: No erythema or rash.  Neurological:     Mental  Status: He is alert.     Cranial Nerves: No cranial nerve deficit.     Motor: No abnormal muscle tone.     Coordination: Coordination normal.     Deep Tendon Reflexes: Reflexes are normal and symmetric.  Psychiatric:        Behavior: Behavior normal.        Thought Content: Thought content normal.        Judgment: Judgment normal.     LABS: Recent Results (from the past 2160 hour(s))  PSA Total (Reflex To Free)     Status: None   Collection Time: 05/20/21 10:22 AM  Result Value Ref Range   Prostate Specific Ag, Serum 1.0 0.0 - 4.0 ng/mL    Comment: Roche ECLIA methodology. According to the American Urological Association, Serum PSA should decrease and remain at undetectable levels after radical prostatectomy. The AUA defines biochemical recurrence as an initial PSA value 0.2 ng/mL or greater followed by a subsequent confirmatory PSA value 0.2 ng/mL or greater. Values obtained with different assay methods or kits cannot be used interchangeably. Results cannot be interpreted as absolute evidence of the presence or absence of malignant disease.    Reflex Criteria Comment     Comment: The percent free PSA is performed on a reflex basis only when the total PSA is between 4.0 and 10.0 ng/mL.   TSH + free T4     Status: None   Collection Time: 05/20/21 10:22 AM  Result Value Ref Range   TSH 1.770 0.450 - 4.500 uIU/mL   Free T4 0.96 0.82 - 1.77 ng/dL  Lipid Panel With LDL/HDL Ratio     Status: None   Collection Time: 05/20/21 10:22 AM  Result Value Ref Range   Cholesterol, Total 157 100 - 199 mg/dL   Triglycerides 65 0 - 149 mg/dL   HDL 64 >39 mg/dL   VLDL Cholesterol Cal 13 5 - 40 mg/dL   LDL Chol Calc (NIH) 80 0 - 99 mg/dL   LDL/HDL Ratio 1.3 0.0 - 3.6 ratio    Comment:                                     LDL/HDL Ratio                                             Men  Women                               1/2 Avg.Risk  1.0    1.5                                   Avg.Risk  3.6     3.2                                2X Avg.Risk  6.2    5.0                                3X Avg.Risk  8.0    6.1   CBC w/Diff/Platelet     Status: None   Collection Time: 05/20/21 10:22 AM  Result Value Ref Range   WBC 5.6 3.4 - 10.8 x10E3/uL   RBC 5.76 4.14 - 5.80 x10E6/uL   Hemoglobin 15.3 13.0 - 17.7 g/dL   Hematocrit 46.3 37.5 - 51.0 %   MCV 80 79 - 97 fL   MCH 26.6 26.6 - 33.0 pg   MCHC 33.0 31.5 - 35.7 g/dL   RDW 12.8 11.6 - 15.4 %   Platelets 227 150 - 450 x10E3/uL   Neutrophils 56 Not Estab. %   Lymphs 32 Not Estab. %   Monocytes  9 Not Estab. %   Eos 2 Not Estab. %   Basos 1 Not Estab. %   Neutrophils Absolute 3.1 1.4 - 7.0 x10E3/uL   Lymphocytes Absolute 1.8 0.7 - 3.1 x10E3/uL   Monocytes Absolute 0.5 0.1 - 0.9 x10E3/uL   EOS (ABSOLUTE) 0.1 0.0 - 0.4 x10E3/uL   Basophils Absolute 0.1 0.0 - 0.2 x10E3/uL   Immature Granulocytes 0 Not Estab. %   Immature Grans (Abs) 0.0 0.0 - 0.1 x10E3/uL  Comprehensive metabolic panel     Status: Abnormal   Collection Time: 05/20/21 10:22 AM  Result Value Ref Range   Glucose 110 (H) 65 - 99 mg/dL   BUN 15 8 - 27 mg/dL   Creatinine, Ser 1.16 0.76 - 1.27 mg/dL   eGFR 71 >59 mL/min/1.73   BUN/Creatinine Ratio 13 10 - 24   Sodium 139 134 - 144 mmol/L   Potassium 5.0 3.5 - 5.2 mmol/L   Chloride 103 96 - 106 mmol/L   CO2 24 20 - 29 mmol/L   Calcium 9.4 8.6 - 10.2 mg/dL   Total Protein 7.2 6.0 - 8.5 g/dL   Albumin 4.7 3.8 - 4.8 g/dL   Globulin, Total 2.5 1.5 - 4.5 g/dL   Albumin/Globulin Ratio 1.9 1.2 - 2.2   Bilirubin Total 0.3 0.0 - 1.2 mg/dL   Alkaline Phosphatase 67 44 - 121 IU/L   AST 17 0 - 40 IU/L   ALT 12 0 - 44 IU/L  Hgb A1C w/o eAG     Status: Abnormal   Collection Time: 05/20/21 10:22 AM  Result Value Ref Range   Hgb A1c MFr Bld 6.3 (H) 4.8 - 5.6 %    Comment:          Prediabetes: 5.7 - 6.4          Diabetes: >6.4          Glycemic control for adults with diabetes: <7.0   UA/M w/rflx Culture, Routine      Status: None   Collection Time: 05/20/21  3:59 PM   Specimen: Urine   Urine  Result Value Ref Range   Specific Gravity, UA 1.018 1.005 - 1.030   pH, UA 5.5 5.0 - 7.5   Color, UA Yellow Yellow   Appearance Ur Clear Clear   Leukocytes,UA Negative Negative   Protein,UA Negative Negative/Trace   Glucose, UA Negative Negative   Ketones, UA Negative Negative   RBC, UA Negative Negative   Bilirubin, UA Negative Negative   Urobilinogen, Ur 0.2 0.2 - 1.0 mg/dL   Nitrite, UA Negative Negative   Microscopic Examination Comment     Comment: Microscopic follows if indicated.   Microscopic Examination See below:     Comment: Microscopic was indicated and was performed.   Urinalysis Reflex Comment     Comment: This specimen will not reflex to a Urine Culture.  Microscopic Examination     Status: None   Collection Time: 05/20/21  3:59 PM   Urine  Result Value Ref Range   WBC, UA None seen 0 - 5 /hpf   RBC None seen 0 - 2 /hpf   Epithelial Cells (non renal) None seen 0 - 10 /hpf   Casts None seen None seen /lpf   Bacteria, UA None seen None seen/Few       Assessment/Plan: 1. Encounter for general adult medical examination with abnormal findings CPE performed, will order routine labs, UTD on colonoscopy though will call if any recurrence of dark stools as GI  referral may be warranted  2. Essential hypertension Stable, continue current medication  3. Low testosterone Followed by urology  4. Gastroesophageal reflux disease without esophagitis Stable, may continue protonix. Will consider upper GI or GI referral if worsening along with any signs of GI bleed - pantoprazole (PROTONIX) 40 MG tablet; Take 1 tablet (40 mg total) by mouth daily.  Dispense: 90 tablet; Refill: 1  5. Mixed hyperlipidemia Continue crestor and will update labs - Lipid Panel With LDL/HDL Ratio  6. Need for shingles vaccine - Zoster Vaccine Adjuvanted [facility]) injection; Inject 0.5 mLs into the muscle once for  1 dose.  Dispense: 0.5 mL; Refill: 0  7. Special screening for malignant neoplasm of prostate - PSA Total (Reflex To Free)  8. Prediabetes Continue to work on diet and exercise - Hgb A1C w/o eAG  9. Other fatigue - TSH + free T4 - CBC w/Diff/Platelet - Comprehensive metabolic panel  10. Dysuria - UA/M w/rflx Culture, Routine - Microscopic Examination   General Counseling: Emmitte verbalizes understanding of the findings of todays visit and agrees with plan of treatment. I have discussed any further diagnostic evaluation that may be needed or ordered today. We also reviewed his medications today. he has been encouraged to call the office with any questions or concerns that should arise related to todays visit.    Counseling:    Orders Placed This Encounter  Procedures   Microscopic Examination   PSA Total (Reflex To Free)   TSH + free T4   Lipid Panel With LDL/HDL Ratio   CBC w/Diff/Platelet   Comprehensive metabolic panel   Hgb Q3F w/o eAG   UA/M w/rflx Culture, Routine    Meds ordered this encounter  Medications   pantoprazole (PROTONIX) 40 MG tablet    Sig: Take 1 tablet (40 mg total) by mouth daily.    Dispense:  90 tablet    Refill:  1   Zoster Vaccine Adjuvanted [facility]) injection    Sig: Inject 0.5 mLs into the muscle once for 1 dose.    Dispense:  0.5 mL    Refill:  0    This patient was seen by Drema Dallas, PA-C in collaboration with Dr. Clayborn Bigness as a part of collaborative care agreement.  Total time spent:40 Minutes  Time spent includes review of chart, medications, test results, and follow up plan with the patient.     Lavera Guise, MD  Internal Medicine

## 2021-05-21 LAB — CBC WITH DIFFERENTIAL/PLATELET
Basophils Absolute: 0.1 10*3/uL (ref 0.0–0.2)
Basos: 1 %
EOS (ABSOLUTE): 0.1 10*3/uL (ref 0.0–0.4)
Eos: 2 %
Hematocrit: 46.3 % (ref 37.5–51.0)
Hemoglobin: 15.3 g/dL (ref 13.0–17.7)
Immature Grans (Abs): 0 10*3/uL (ref 0.0–0.1)
Immature Granulocytes: 0 %
Lymphocytes Absolute: 1.8 10*3/uL (ref 0.7–3.1)
Lymphs: 32 %
MCH: 26.6 pg (ref 26.6–33.0)
MCHC: 33 g/dL (ref 31.5–35.7)
MCV: 80 fL (ref 79–97)
Monocytes Absolute: 0.5 10*3/uL (ref 0.1–0.9)
Monocytes: 9 %
Neutrophils Absolute: 3.1 10*3/uL (ref 1.4–7.0)
Neutrophils: 56 %
Platelets: 227 10*3/uL (ref 150–450)
RBC: 5.76 x10E6/uL (ref 4.14–5.80)
RDW: 12.8 % (ref 11.6–15.4)
WBC: 5.6 10*3/uL (ref 3.4–10.8)

## 2021-05-21 LAB — COMPREHENSIVE METABOLIC PANEL
ALT: 12 IU/L (ref 0–44)
AST: 17 IU/L (ref 0–40)
Albumin/Globulin Ratio: 1.9 (ref 1.2–2.2)
Albumin: 4.7 g/dL (ref 3.8–4.8)
Alkaline Phosphatase: 67 IU/L (ref 44–121)
BUN/Creatinine Ratio: 13 (ref 10–24)
BUN: 15 mg/dL (ref 8–27)
Bilirubin Total: 0.3 mg/dL (ref 0.0–1.2)
CO2: 24 mmol/L (ref 20–29)
Calcium: 9.4 mg/dL (ref 8.6–10.2)
Chloride: 103 mmol/L (ref 96–106)
Creatinine, Ser: 1.16 mg/dL (ref 0.76–1.27)
Globulin, Total: 2.5 g/dL (ref 1.5–4.5)
Glucose: 110 mg/dL — ABNORMAL HIGH (ref 65–99)
Potassium: 5 mmol/L (ref 3.5–5.2)
Sodium: 139 mmol/L (ref 134–144)
Total Protein: 7.2 g/dL (ref 6.0–8.5)
eGFR: 71 mL/min/{1.73_m2} (ref >59)

## 2021-05-21 LAB — UA/M W/RFLX CULTURE, ROUTINE
Bilirubin, UA: NEGATIVE
Glucose, UA: NEGATIVE
Ketones, UA: NEGATIVE
Leukocytes,UA: NEGATIVE
Nitrite, UA: NEGATIVE
Protein,UA: NEGATIVE
RBC, UA: NEGATIVE
Specific Gravity, UA: 1.018 (ref 1.005–1.030)
Urobilinogen, Ur: 0.2 mg/dL (ref 0.2–1.0)
pH, UA: 5.5 (ref 5.0–7.5)

## 2021-05-21 LAB — MICROSCOPIC EXAMINATION
Bacteria, UA: NONE SEEN
Casts: NONE SEEN /lpf
Epithelial Cells (non renal): NONE SEEN /hpf (ref 0–10)
RBC, Urine: NONE SEEN /hpf (ref 0–2)
WBC, UA: NONE SEEN /hpf (ref 0–5)

## 2021-05-21 LAB — PSA TOTAL (REFLEX TO FREE): Prostate Specific Ag, Serum: 1 ng/mL (ref 0.0–4.0)

## 2021-05-21 LAB — TSH+FREE T4
Free T4: 0.96 ng/dL (ref 0.82–1.77)
TSH: 1.77 u[IU]/mL (ref 0.450–4.500)

## 2021-05-21 LAB — LIPID PANEL WITH LDL/HDL RATIO
Cholesterol, Total: 157 mg/dL (ref 100–199)
HDL: 64 mg/dL (ref >39)
LDL Chol Calc (NIH): 80 mg/dL (ref 0–99)
LDL/HDL Ratio: 1.3 ratio (ref 0.0–3.6)
Triglycerides: 65 mg/dL (ref 0–149)
VLDL Cholesterol Cal: 13 mg/dL (ref 5–40)

## 2021-05-21 LAB — HGB A1C W/O EAG: Hgb A1c MFr Bld: 6.3 % — ABNORMAL HIGH (ref 4.8–5.6)

## 2021-06-16 DIAGNOSIS — E785 Hyperlipidemia, unspecified: Secondary | ICD-10-CM | POA: Insufficient documentation

## 2021-06-16 DIAGNOSIS — N529 Male erectile dysfunction, unspecified: Secondary | ICD-10-CM | POA: Insufficient documentation

## 2021-06-18 ENCOUNTER — Telehealth: Payer: Self-pay

## 2021-06-19 ENCOUNTER — Other Ambulatory Visit: Payer: Self-pay | Admitting: Physician Assistant

## 2021-06-19 DIAGNOSIS — N529 Male erectile dysfunction, unspecified: Secondary | ICD-10-CM

## 2021-06-19 MED ORDER — TADALAFIL 20 MG PO TABS: 20.0000 mg | ORAL_TABLET | ORAL | 0 refills | Status: DC | PRN

## 2021-06-19 NOTE — Telephone Encounter (Signed)
Pt advised that he cannot take a cialis every its should be as needed and also he need  to follow up with urology

## 2021-06-20 ENCOUNTER — Encounter: Payer: BC Managed Care – PPO | Admitting: Physician Assistant

## 2021-06-21 ENCOUNTER — Other Ambulatory Visit: Payer: Self-pay

## 2021-06-21 DIAGNOSIS — I10 Essential (primary) hypertension: Secondary | ICD-10-CM

## 2021-06-21 MED ORDER — ENALAPRIL MALEATE 5 MG PO TABS: 5.0000 mg | ORAL_TABLET | Freq: Every day | ORAL | 1 refills | Status: DC

## 2021-06-25 ENCOUNTER — Other Ambulatory Visit: Payer: Self-pay

## 2021-06-25 DIAGNOSIS — I10 Essential (primary) hypertension: Secondary | ICD-10-CM

## 2021-06-25 MED ORDER — ENALAPRIL MALEATE 5 MG PO TABS: 5.0000 mg | ORAL_TABLET | Freq: Every day | ORAL | 1 refills | Status: DC

## 2021-08-12 ENCOUNTER — Other Ambulatory Visit: Payer: Self-pay

## 2021-08-12 ENCOUNTER — Ambulatory Visit: Payer: BC Managed Care – PPO | Admitting: Physician Assistant

## 2021-08-12 ENCOUNTER — Encounter: Payer: Self-pay | Admitting: Physician Assistant

## 2021-08-12 VITALS — BP 130/72 | HR 88 | Temp 98.0°F | Resp 16 | Ht 69.25 in | Wt 220.0 lb

## 2021-08-12 DIAGNOSIS — R0989 Other specified symptoms and signs involving the circulatory and respiratory systems: Secondary | ICD-10-CM | POA: Diagnosis not present

## 2021-08-12 DIAGNOSIS — R7989 Other specified abnormal findings of blood chemistry: Secondary | ICD-10-CM | POA: Diagnosis not present

## 2021-08-12 DIAGNOSIS — M542 Cervicalgia: Secondary | ICD-10-CM

## 2021-08-12 DIAGNOSIS — R7303 Prediabetes: Secondary | ICD-10-CM

## 2021-08-12 DIAGNOSIS — E782 Mixed hyperlipidemia: Secondary | ICD-10-CM

## 2021-08-12 DIAGNOSIS — I10 Essential (primary) hypertension: Secondary | ICD-10-CM | POA: Diagnosis not present

## 2021-08-12 DIAGNOSIS — G4710 Hypersomnia, unspecified: Secondary | ICD-10-CM

## 2021-08-12 DIAGNOSIS — K219 Gastro-esophageal reflux disease without esophagitis: Secondary | ICD-10-CM

## 2021-08-12 MED ORDER — ROSUVASTATIN CALCIUM 10 MG PO TABS: 10.0000 mg | ORAL_TABLET | Freq: Every day | ORAL | 1 refills | Status: DC

## 2021-08-12 NOTE — Progress Notes (Signed)
[facility] [place], [place] [address]  Internal MEDICINE  Office Visit Note  Patient Name: Fred Reeves  [id number]  [id number]  Date of Service: 08/12/2021  Chief Complaint  Patient presents with   Follow-up   Hypertension   Neck Pain    Right side  and arm going on and off  for few months     HPI Pt is here for routine follow up with multiple medical concerns -Occasionally some high BP readings at home, one time was 180/90, but is well controlled in office. Will start recording at home and may call if remaining high and can consider increasing medication. -He was advised to follow up with urology regarding low testosterone and ED--refills of ED meds given in our office in the meantime but was told to have f/u with urology for all future refills. He has not followed up with them still and will do so now. -Is having some right arm and right neck pain. It is off an on and will try heating pads and tylenol, but sometimes wakes him up. Not constant and he is right handed.  -Is concerned about plaque and any other labs that can be done to evaluate this. Discussed his cholesterol was checked and looked good and he should continue the crestor. Will go ahead and check a carotid US for further eval -Labs reviewed and overall looked good--did have elevated A1c of 6.3 in prediabetic range still -Very sleepy as well and can sit and nod off and is concerned that this. Had previously been ordered a sleep study but never had this done and is willing to do so now.  EPWORTH SLEEPINESS SCALE:  Scale:  (0)= no chance of dozing; (1)= slight chance of dozing; (2)= moderate chance of dozing; (3)= high chance of dozing  Chance  Situtation    Sitting and reading: 3    Watching TV: 3    Sitting Inactive in public: 2    As a passenger in car: 3      Lying down to rest: 3    Sitting and talking: 0    Sitting quielty after lunch: 2    In a car, stopped in traffic:  2   TOTAL SCORE:   18 out of 24   Current Medication: Outpatient Encounter Medications as of 08/12/2021  Medication Sig   enalapril (VASOTEC) 5 MG tablet Take 1 tablet (5 mg total) by mouth daily.   Glucosamine HCl (GLUCOSAMINE PO) Take by mouth.   NEEDLE, DISP, 18 G (B-D BLUNT FILL NEEDLE) 18G X 1-1/2" MISC 1 each by Does not apply route every 14 (fourteen) days.   pantoprazole (PROTONIX) 40 MG tablet Take 1 tablet (40 mg total) by mouth daily.   tadalafil (CIALIS) 20 MG tablet Take 1 tablet (20 mg total) by mouth every other day as needed for erectile dysfunction.   [DISCONTINUED] rosuvastatin (CRESTOR) 10 MG tablet Take 1 tablet (10 mg total) by mouth daily.   [DISCONTINUED] Syringe/Needle, Disp, (SYRINGE 3CC/21GX1-1/2") 21G X 1-1/2" 3 ML MISC 1 Syringe by Does not apply route every 14 (fourteen) days.   [DISCONTINUED] testosterone cypionate (DEPOTESTOSTERONE CYPIONATE) 200 MG/ML injection Inject 1 mL (200 mg total) into the muscle every 14 (fourteen) days.   No facility-administered encounter medications on file as of 08/12/2021.    Surgical History: Past Surgical History:  Procedure Laterality Date   COLONOSCOPY     COLONOSCOPY WITH PROPOFOL N/A 04/10/2020   Procedure: COLONOSCOPY WITH PROPOFOL;  Surgeon:  [name], MD;  Location: [facility] ENDOSCOPY;  Service: Gastroenterology;  Laterality: N/A;    Medical History: Past Medical History:  Diagnosis Date   Hypertension     Family History: Family History  Problem Relation Age of Onset   Stroke Mother    Aneurysm Brother     Social History   Socioeconomic History   Marital status: Married    Spouse name: Not on file   Number of children: Not on file   Years of education: Not on file   Highest education level: Not on file  Occupational History   Not on file  Tobacco Use   Smoking status: Former    Types: Cigarettes   Smokeless tobacco: Never  Substance and Sexual Activity   Alcohol use: Yes    Comment: moderate    Drug use: Never   Sexual activity: Not on file  Other Topics Concern   Not on file  Social History Narrative   Not on file   Social Determinants of Health   Financial Resource Strain: Not on file  Food Insecurity: Not on file  Transportation Needs: Not on file  Physical Activity: Not on file  Stress: Not on file  Social Connections: Not on file  Intimate Partner Violence: Not on file      Review of Systems  Constitutional:  Negative for chills, fatigue and unexpected weight change.  HENT:  Negative for congestion, postnasal drip, rhinorrhea, sneezing and sore throat.   Eyes:  Negative for redness.  Respiratory:  Negative for cough, chest tightness and shortness of breath.   Cardiovascular:  Negative for chest pain and palpitations.  Gastrointestinal:  Negative for abdominal pain, blood in stool, constipation, diarrhea, nausea and vomiting.  Genitourinary:  Negative for dysuria and frequency.  Musculoskeletal:  Positive for arthralgias and myalgias. Negative for back pain, joint swelling and neck pain.  Skin:  Negative for rash.  Neurological: Negative.  Negative for tremors, numbness and headaches.  Hematological:  Negative for adenopathy. Does not bruise/bleed easily.  Psychiatric/Behavioral:  Positive for sleep disturbance. Negative for behavioral problems (Depression) and suicidal ideas. The patient is nervous/anxious.    Vital Signs: BP 130/72   Pulse 88   Temp 98 F (36.7 C)   Resp 16   Ht 5' 9.25" (1.759 m)   Wt 220 lb (99.8 kg)   SpO2 98%   BMI 32.25 kg/m    Physical Exam Vitals and nursing note reviewed.  Constitutional:      General: He is not in acute distress.    Appearance: He is well-developed. He is obese. He is not diaphoretic.  HENT:     Head: Normocephalic and atraumatic.     Mouth/Throat:     Pharynx: No oropharyngeal exudate.  Eyes:     Pupils: Pupils are equal, round, and reactive to light.  Neck:     Thyroid: No thyromegaly.      Vascular: No JVD.     Trachea: No tracheal deviation.  Cardiovascular:     Rate and Rhythm: Normal rate and regular rhythm.     Heart sounds: Normal heart sounds. No murmur heard.   No friction rub. No gallop.  Pulmonary:     Effort: Pulmonary effort is normal. No respiratory distress.     Breath sounds: No wheezing or rales.  Chest:     Chest wall: No tenderness.  Abdominal:     General: Bowel sounds are normal.     Palpations: Abdomen is soft.  Musculoskeletal:  General: Normal range of motion.     Cervical back: Normal range of motion and neck supple.  Lymphadenopathy:     Cervical: No cervical adenopathy.  Skin:    General: Skin is warm and dry.  Neurological:     Mental Status: He is alert and oriented to person, place, and time.     Cranial Nerves: No cranial nerve deficit.  Psychiatric:        Behavior: Behavior normal.        Thought Content: Thought content normal.        Judgment: Judgment normal.       Assessment/Plan: 1. Essential hypertension Stable in office, will continue current medication and pt will monitor at home  2. Hypersomnia Based on excessive daytime sleepiness, snoring, waking up in middle of night, hx of ED and low testosterone, and elevated BMI will go ahead and schedule PSG for further eval - PSG SLEEP STUDY  3. Low testosterone Advised to follow up with urology  4. Bilateral carotid bruits Will check [place] - US Carotid Duplex Bilateral; Future  5. Mixed hyperlipidemia Stable, continue crestor  6. Gastroesophageal reflux disease without esophagitis Continue PPI  7. Prediabetes Continue to work on diet and exercise  8. Neck pain on right side May continue tylenol and heating pad as needed since is off and on. Should also work on gentle stretching as this is his dominate side and may be due to use of dominate arm and associated posture. Consider ortho referral if worsening.   General Counseling: branon sabine understanding  of the findings of todays visit and agrees with plan of treatment. I have discussed any further diagnostic evaluation that may be needed or ordered today. We also reviewed his medications today. he has been encouraged to call the office with any questions or concerns that should arise related to todays visit.    Orders Placed This Encounter  Procedures   US Carotid Duplex Bilateral   PSG SLEEP STUDY     No orders of the defined types were placed in this encounter.   This patient was seen by Drema Dallas, PA-C in collaboration with Dr. Clayborn Bigness as a part of collaborative care agreement.   Total time spent:40 Minutes Time spent includes review of chart, medications, test results, and follow up plan with the patient.      Dr Lavera Guise Internal medicine

## 2021-08-28 ENCOUNTER — Other Ambulatory Visit: Payer: Self-pay

## 2021-08-28 ENCOUNTER — Ambulatory Visit (INDEPENDENT_AMBULATORY_CARE_PROVIDER_SITE_OTHER): Payer: BC Managed Care – PPO

## 2021-08-28 DIAGNOSIS — R0989 Other specified symptoms and signs involving the circulatory and respiratory systems: Secondary | ICD-10-CM | POA: Diagnosis not present

## 2021-09-19 ENCOUNTER — Ambulatory Visit: Payer: BC Managed Care – PPO | Admitting: Physician Assistant

## 2021-09-20 ENCOUNTER — Ambulatory Visit: Payer: BC Managed Care – PPO | Admitting: Physician Assistant

## 2021-09-20 ENCOUNTER — Other Ambulatory Visit: Payer: Self-pay

## 2021-09-20 ENCOUNTER — Encounter: Payer: Self-pay | Admitting: Physician Assistant

## 2021-09-20 DIAGNOSIS — M542 Cervicalgia: Secondary | ICD-10-CM

## 2021-09-20 DIAGNOSIS — E782 Mixed hyperlipidemia: Secondary | ICD-10-CM

## 2021-09-20 DIAGNOSIS — M79601 Pain in right arm: Secondary | ICD-10-CM

## 2021-09-20 DIAGNOSIS — I10 Essential (primary) hypertension: Secondary | ICD-10-CM | POA: Diagnosis not present

## 2021-09-20 NOTE — Progress Notes (Signed)
[facility] [place], [place] [address]  Internal MEDICINE  Office Visit Note  Patient Name: Fred Reeves  [id number]  [id number]  Date of Service: 09/30/2021  Chief Complaint  Patient presents with   Follow-up   Hypertension    Pt is concerned about blood pressure   Arm Pain    Right arm that has been ongoing, no improvement   Neck Pain    Neck pain also has not improved; intermittent and sometimes wakes pt up in the night    HPI Pt is here for routine follow up -Carotid US reviewed and did not show any significant plaque and <50% stenosis bilaterally. He is already on crestor and will continue this as before -Received call for PSG, wasn't ready to schedule yet and will wait until after Jan 1st. May reach out to the [facility] for this as well -BP at home can fluctuate, 147/85 last night, but normally well controlled. -Tried massager and heating pad, tried tylenol, but neck still hurts along right side and into arm. Discussed seeing ortho at this point and considering PT  Current Medication: Outpatient Encounter Medications as of 09/20/2021  Medication Sig   enalapril (VASOTEC) 5 MG tablet Take 1 tablet (5 mg total) by mouth daily.   Glucosamine HCl (GLUCOSAMINE PO) Take by mouth.   NEEDLE, DISP, 18 G (B-D BLUNT FILL NEEDLE) 18G X 1-1/2" MISC 1 each by Does not apply route every 14 (fourteen) days.   pantoprazole (PROTONIX) 40 MG tablet Take 1 tablet (40 mg total) by mouth daily.   rosuvastatin (CRESTOR) 10 MG tablet Take 1 tablet (10 mg total) by mouth daily.   tadalafil (CIALIS) 20 MG tablet Take 1 tablet (20 mg total) by mouth every other day as needed for erectile dysfunction.   No facility-administered encounter medications on file as of 09/20/2021.    Surgical History: Past Surgical History:  Procedure Laterality Date   COLONOSCOPY     COLONOSCOPY WITH PROPOFOL N/A 04/10/2020   Procedure: COLONOSCOPY WITH PROPOFOL;  Surgeon: Fred Bellows, MD;   [facility]: [facility] ENDOSCOPY;  Service: Gastroenterology;  Laterality: N/A;    Medical History: Past Medical History:  Diagnosis Date   Hypertension     Family History: Family History  Problem Relation Age of Onset   Stroke Mother    Aneurysm Brother     Social History   Socioeconomic History   Marital status: Married    Spouse name: Not on file   Number of children: Not on file   Years of education: Not on file   Highest education level: Not on file  Occupational History   Not on file  Tobacco Use   Smoking status: Former    Types: Cigarettes   Smokeless tobacco: Never  Substance and Sexual Activity   Alcohol use: Yes    Comment: moderate   Drug use: Never   Sexual activity: Not on file  Other Topics Concern   Not on file  Social History Narrative   Not on file   Social Determinants of Health   Financial Resource Strain: Not on file  Food Insecurity: Not on file  Transportation Needs: Not on file  Physical Activity: Not on file  Stress: Not on file  Social Connections: Not on file  Intimate Partner Violence: Not on file      Review of Systems  Constitutional:  Negative for chills, fatigue and unexpected weight change.  HENT:  Negative for congestion, postnasal drip, rhinorrhea, sneezing and sore  throat.   Eyes:  Negative for redness.  Respiratory:  Negative for cough, chest tightness and shortness of breath.   Cardiovascular:  Negative for chest pain and palpitations.  Gastrointestinal:  Negative for abdominal pain, blood in stool, constipation, diarrhea, nausea and vomiting.  Genitourinary:  Negative for dysuria and frequency.  Musculoskeletal:  Positive for arthralgias and myalgias. Negative for back pain, joint swelling and neck pain.  Skin:  Negative for rash.  Neurological: Negative.  Negative for tremors, numbness and headaches.  Hematological:  Negative for adenopathy. Does not bruise/bleed easily.  Psychiatric/Behavioral:  Positive for sleep  disturbance. Negative for behavioral problems (Depression) and suicidal ideas. The patient is nervous/anxious.    Vital Signs: BP 122/70 Comment: 147/81   Pulse 86    Temp 98.6 F (37 C)    Resp 16    Ht 5\' 9"  (1.753 m)    Wt 225 lb 9.6 oz (102.3 kg)    SpO2 99%    BMI 33.32 kg/m    Physical Exam Vitals and nursing note reviewed.  Constitutional:      General: He is not in acute distress.    Appearance: He is well-developed. He is obese. He is not diaphoretic.  HENT:     Head: Normocephalic and atraumatic.     Mouth/Throat:     Pharynx: No oropharyngeal exudate.  Eyes:     Pupils: Pupils are equal, round, and reactive to light.  Neck:     Thyroid: No thyromegaly.     Vascular: No JVD.     Trachea: No tracheal deviation.  Cardiovascular:     Rate and Rhythm: Normal rate and regular rhythm.     Heart sounds: Normal heart sounds. No murmur heard.   No friction rub. No gallop.  Pulmonary:     Effort: Pulmonary effort is normal. No respiratory distress.     Breath sounds: No wheezing or rales.  Chest:     Chest wall: No tenderness.  Abdominal:     General: Bowel sounds are normal.     Palpations: Abdomen is soft.  Musculoskeletal:        General: Normal range of motion.     Cervical back: Normal range of motion and neck supple.  Lymphadenopathy:     Cervical: No cervical adenopathy.  Skin:    General: Skin is warm and dry.  Neurological:     Mental Status: He is alert and oriented to person, place, and time.     Cranial Nerves: No cranial nerve deficit.  Psychiatric:        Behavior: Behavior normal.        Thought Content: Thought content normal.        Judgment: Judgment normal.       Assessment/Plan: 1. Essential hypertension Stable, continue current medication  2. Mixed hyperlipidemia Continue crestor  3. Neck pain on right side Tried conservative options, will refer to ortho, may need PT - AMB referral to orthopedics  4. Right arm pain Tried  conservative options, will refer to ortho, may need PT - AMB referral to orthopedics   General Counseling: Fred Reeves understanding of the findings of todays visit and agrees with plan of treatment. I have discussed any further diagnostic evaluation that may be needed or ordered today. We also reviewed his medications today. he has been encouraged to call the office with any questions or concerns that should arise related to todays visit.    Orders Placed This Encounter  Procedures  AMB referral to orthopedics    No orders of the defined types were placed in this encounter.   This patient was seen by Drema Dallas, PA-C in collaboration with Dr. Clayborn Bigness as a part of collaborative care agreement.   Total time spent:30 Minutes Time spent includes review of chart, medications, test results, and follow up plan with the patient.      Dr Lavera Guise Internal medicine

## 2021-09-24 ENCOUNTER — Telehealth: Payer: Self-pay

## 2021-09-24 NOTE — Telephone Encounter (Signed)
Awaiting 09/20/21 office notes for Orthopedic referral-Toni

## 2021-10-02 NOTE — Telephone Encounter (Signed)
Ortho referral sent via [facility] to [facility]

## 2021-10-09 NOTE — Telephone Encounter (Signed)
Ortho appointment 10/14/21 @ 3:30-Toni

## 2021-10-14 DIAGNOSIS — M5030 Other cervical disc degeneration, unspecified cervical region: Secondary | ICD-10-CM | POA: Diagnosis not present

## 2021-10-14 DIAGNOSIS — M542 Cervicalgia: Secondary | ICD-10-CM | POA: Diagnosis not present

## 2021-10-14 DIAGNOSIS — M7551 Bursitis of right shoulder: Secondary | ICD-10-CM | POA: Diagnosis not present

## 2021-10-14 DIAGNOSIS — M5412 Radiculopathy, cervical region: Secondary | ICD-10-CM | POA: Diagnosis not present

## 2021-10-15 NOTE — Procedures (Signed)
[place], [place] [address]  DATE OF SERVICE: August 28, 2021  CAROTID DOPPLER INTERPRETATION:  Bilateral Carotid Ultrsasound and Color Doppler Examination was performed. The RIGHT CCA shows no significant plaque in the vessel. The LEFT CCA shows no significant plaque in the vessel. There was no significant intimal thickening noted in the RIGHT carotid artery. There was no significant intimal thickening in the LEFT carotid artery.  The RIGHT CCA shows peak systolic velocity of 83 cm per second. The end diastolic velocity is 33 cm per second on the RIGHT side. The RIGHT ICA shows peak systolic velocity of 74 per second. RIGHT sided ICA end diastolic velocity is 39 cm per second. The RIGHT ECA shows a peak systolic velocity of 883 cm per second. The ICA/CCA ratio is calculated to be 0.9. This suggests less than 50% stenosis. The Vertebral Artery shows antegrade flow.  The LEFT CCA shows peak systolic velocity of 80 cm per second. The end diastolic velocity is 32 cm per second on the LEFT side. The LEFT ICA shows peak systolic velocity of 254 per second. LEFT sided ICA end diastolic velocity is 31 cm per second. The LEFT ECA shows a peak systolic velocity of 76 cm per second. The ICA/CCA ratio is calculated to be 1.27. This suggests less than 50% stenosis. The Vertebral Artery shows antegrade flow.   Impression:    The RIGHT CAROTID shows less than 50% stenosis. The LEFT CAROTID shows less than 50% stenosis.  There is insignificant plaque formation noted on the LEFT and insignificant on the RIGHT  side. Consider a repeat Carotid doppler if clinical situation and symptoms warrant in 6-12 months. Patient should be encouraged to change lifestyles such as smoking cessation, regular exercise and dietary modification. Use of statins in the right clinical setting and ASA is encouraged.  Allyne Gee, MD [facility] Pulmonary Critical Care Medicine

## 2021-11-04 ENCOUNTER — Other Ambulatory Visit: Payer: Self-pay | Admitting: Physician Assistant

## 2021-11-04 DIAGNOSIS — I10 Essential (primary) hypertension: Secondary | ICD-10-CM

## 2021-11-12 ENCOUNTER — Encounter: Payer: Self-pay | Admitting: Internal Medicine

## 2021-11-13 ENCOUNTER — Telehealth: Payer: Self-pay

## 2021-11-13 NOTE — Telephone Encounter (Signed)
Error

## 2021-11-13 NOTE — Telephone Encounter (Signed)
Spoke with pt already had appt in person to discuss result and he aware

## 2021-11-13 NOTE — Telephone Encounter (Signed)
-----   Message from Mylinda Latina, PA-C sent at 11/12/2021  5:14 PM EST ----- Please let patient know that his carotid US looked good. He did not have any significant plaque or stenosis seen on exam. Continue crestor as before

## 2021-12-16 ENCOUNTER — Ambulatory Visit: Payer: BC Managed Care – PPO | Admitting: Physician Assistant

## 2021-12-16 ENCOUNTER — Other Ambulatory Visit: Payer: Self-pay

## 2021-12-16 ENCOUNTER — Encounter: Payer: Self-pay | Admitting: Physician Assistant

## 2021-12-16 DIAGNOSIS — Z23 Encounter for immunization: Secondary | ICD-10-CM

## 2021-12-16 DIAGNOSIS — N529 Male erectile dysfunction, unspecified: Secondary | ICD-10-CM | POA: Diagnosis not present

## 2021-12-16 DIAGNOSIS — R7989 Other specified abnormal findings of blood chemistry: Secondary | ICD-10-CM

## 2021-12-16 DIAGNOSIS — I10 Essential (primary) hypertension: Secondary | ICD-10-CM

## 2021-12-16 DIAGNOSIS — M542 Cervicalgia: Secondary | ICD-10-CM

## 2021-12-16 MED ORDER — ZOSTER VAC RECOMB ADJUVANTED 50 MCG/0.5ML IM SUSR: 0.5000 mL | Freq: Once | INTRAMUSCULAR | 0 refills | Status: AC

## 2021-12-16 MED ORDER — TADALAFIL 20 MG PO TABS: 20.0000 mg | ORAL_TABLET | ORAL | 0 refills | Status: DC | PRN

## 2021-12-16 NOTE — Progress Notes (Signed)
[facility] ?[address] ?[place], [place] [address] ? ?Internal MEDICINE  ?Office Visit Note ? ?Patient Name: Fred Reeves ? [id number]  ?[id number] ? ?Date of Service: 12/22/2021 ? ?Chief Complaint  ?Patient presents with  ? Follow-up  ? Hypertension  ? ? ?HPI ?Patient is here for routine follow-up ?-Ortho gave him steroids and muscle relaxer for his neck/shoulder pain. Will be set up for PT. Still has tizanidine refills available if needed. He has been taking maybe 1-2 times per week.  ?-BP at home 120/78 but other times 161/89 a few times.  Discussed doubling up enalapril if blood pressure rising ?-[facility] ?-needs to see urology again and request new referral ? ?Current Medication: ?Outpatient Encounter Medications as of 12/16/2021  ?Medication Sig  ? enalapril (VASOTEC) 5 MG tablet TAKE 1 TABLET (5 MG TOTAL) BY MOUTH DAILY.  ? Glucosamine HCl (GLUCOSAMINE PO) Take by mouth.  ? NEEDLE, DISP, 18 G (B-D BLUNT FILL NEEDLE) 18G X 1-1/2" MISC 1 each by Does not apply route every 14 (fourteen) days.  ? pantoprazole (PROTONIX) 40 MG tablet Take 1 tablet (40 mg total) by mouth daily.  ? rosuvastatin (CRESTOR) 10 MG tablet Take 1 tablet (10 mg total) by mouth daily.  ? [DISCONTINUED] tadalafil (CIALIS) 20 MG tablet Take 1 tablet (20 mg total) by mouth every other day as needed for erectile dysfunction.  ? [DISCONTINUED] Zoster Vaccine Adjuvanted [facility]) injection Inject 0.5 mLs into the muscle once.  ? tadalafil (CIALIS) 20 MG tablet Take 1 tablet (20 mg total) by mouth every other day as needed for erectile dysfunction.  ? [EXPIRED] Zoster Vaccine Adjuvanted [facility]) injection Inject 0.5 mLs into the muscle once for 1 dose.  ? ?No facility-administered encounter medications on file as of 12/16/2021.  ? ? ?Surgical History: ?Past Surgical History:  ?Procedure Laterality Date  ? COLONOSCOPY    ? COLONOSCOPY WITH PROPOFOL N/A 04/10/2020  ? Procedure: COLONOSCOPY WITH  PROPOFOL;  Surgeon: [name], MD;  Location: [facility] ENDOSCOPY;  Service: Gastroenterology;  Laterality: N/A;  ? ? ?Medical History: ?Past Medical History:  ?Diagnosis Date  ? Hypertension   ? ? ?Family History: ?Family History  ?Problem Relation Age of Onset  ? Stroke Mother   ? Aneurysm Brother   ? ? ?Social History  ? ?Socioeconomic History  ? Marital status: Married  ?  Spouse name: Not on file  ? Number of children: Not on file  ? Years of education: Not on file  ? Highest education level: Not on file  ?Occupational History  ? Not on file  ?Tobacco Use  ? Smoking status: Former  ?  Types: Cigarettes  ? Smokeless tobacco: Never  ?Substance and Sexual Activity  ? Alcohol use: Yes  ?  Comment: moderate  ? Drug use: Never  ? Sexual activity: Not on file  ?Other Topics Concern  ? Not on file  ?Social History Narrative  ? Not on file  ? ?Social Determinants of Health  ? ?Financial Resource Strain: Not on file  ?Food Insecurity: Not on file  ?Transportation Needs: Not on file  ?Physical Activity: Not on file  ?Stress: Not on file  ?Social Connections: Not on file  ?Intimate Partner Violence: Not on file  ? ? ? ? ?Review of Systems  ?Constitutional:  Negative for chills, fatigue and unexpected weight change.  ?HENT:  Negative for congestion, postnasal drip, rhinorrhea, sneezing and sore throat.   ?Eyes:  Negative for  redness.  ?Respiratory:  Negative for cough, chest tightness and shortness of breath.   ?Cardiovascular:  Negative for chest pain and palpitations.  ?Gastrointestinal:  Negative for abdominal pain, blood in stool, constipation, diarrhea, nausea and vomiting.  ?Genitourinary:  Negative for dysuria and frequency.  ?Musculoskeletal:  Positive for arthralgias and myalgias. Negative for back pain, joint swelling and neck pain.  ?Skin:  Negative for rash.  ?Neurological: Negative.  Negative for tremors, numbness and headaches.  ?Hematological:  Negative for adenopathy. Does not bruise/bleed easily.   ?Psychiatric/Behavioral:  Positive for sleep disturbance. Negative for behavioral problems (Depression) and suicidal ideas. The patient is nervous/anxious.   ? ?Vital Signs: ?BP 138/86 Comment: 145/85  Pulse 68   Temp 98.3 ?F (36.8 ?C)   Resp 16   Ht 5' 9.75" (1.772 m)   Wt 222 lb 9.6 oz (101 kg)   SpO2 98%   BMI 32.17 kg/m?  ? ? ?Physical Exam ?Vitals and nursing note reviewed.  ?Constitutional:   ?   General: He is not in acute distress. ?   Appearance: He is well-developed. He is obese. He is not diaphoretic.  ?HENT:  ?   Head: Normocephalic and atraumatic.  ?   Mouth/Throat:  ?   Pharynx: No oropharyngeal exudate.  ?Eyes:  ?   Pupils: Pupils are equal, round, and reactive to light.  ?Neck:  ?   Thyroid: No thyromegaly.  ?   Vascular: No JVD.  ?   Trachea: No tracheal deviation.  ?Cardiovascular:  ?   Rate and Rhythm: Normal rate and regular rhythm.  ?   Heart sounds: Normal heart sounds. No murmur heard. ?  No friction rub. No gallop.  ?Pulmonary:  ?   Effort: Pulmonary effort is normal. No respiratory distress.  ?   Breath sounds: No wheezing or rales.  ?Chest:  ?   Chest wall: No tenderness.  ?Abdominal:  ?   General: Bowel sounds are normal.  ?   Palpations: Abdomen is soft.  ?Musculoskeletal:     ?   General: Normal range of motion.  ?   Cervical back: Normal range of motion and neck supple.  ?Lymphadenopathy:  ?   Cervical: No cervical adenopathy.  ?Skin: ?   General: Skin is warm and dry.  ?Neurological:  ?   Mental Status: He is alert and oriented to person, place, and time.  ?   Cranial Nerves: No cranial nerve deficit.  ?Psychiatric:     ?   Behavior: Behavior normal.     ?   Thought Content: Thought content normal.     ?   Judgment: Judgment normal.  ? ? ? ? ? ?Assessment/Plan: ?1. Essential hypertension ?BP stable in office however if consistently high readings at home may double up enalapril to a total 10 mg ? ?2. Neck pain on right side ?Followed by Ortho with plan to start PT ? ?3. Low  testosterone ?Refer to urology ?- Ambulatory referral to Urology ? ?4. Erectile dysfunction, unspecified erectile dysfunction type ?May take Cialis as needed, will refer to urology for further evaluation and management ?- Ambulatory referral to Urology ?- tadalafil (CIALIS) 20 MG tablet; Take 1 tablet (20 mg total) by mouth every other day as needed for erectile dysfunction.  Dispense: 30 tablet; Refill: 0 ? ?5. Need for shingles vaccine ?- Zoster Vaccine Adjuvanted [facility]) injection; Inject 0.5 mLs into the muscle once for 1 dose.  Dispense: 0.5 mL; Refill: 0 ? ? ?General Counseling: Burnis Kingfisher  understanding of the findings of todays visit and agrees with plan of treatment. I have discussed any further diagnostic evaluation that may be needed or ordered today. We also reviewed his medications today. he has been encouraged to call the office with any questions or concerns that should arise related to todays visit. ? ? ? ?Orders Placed This Encounter  ?Procedures  ? Ambulatory referral to Urology  ? ? ?Meds ordered this encounter  ?Medications  ? Zoster Vaccine Adjuvanted [facility]) injection  ?  Sig: Inject 0.5 mLs into the muscle once for 1 dose.  ?  Dispense:  0.5 mL  ?  Refill:  0  ? tadalafil (CIALIS) 20 MG tablet  ?  Sig: Take 1 tablet (20 mg total) by mouth every other day as needed for erectile dysfunction.  ?  Dispense:  30 tablet  ?  Refill:  0  ? ? ?This patient was seen by Drema Dallas, PA-C in collaboration with Dr. Clayborn Bigness as a part of collaborative care agreement. ? ? ?Total time spent:30 Minutes ?Time spent includes review of chart, medications, test results, and follow up plan with the patient.  ? ? ? ? ?Dr Lavera Guise ?Internal medicine  ?

## 2022-01-06 ENCOUNTER — Telehealth: Payer: Self-pay

## 2022-01-06 NOTE — Telephone Encounter (Signed)
Feeling Fred Reeves has contacted the patient and he stated that he was going somewhere else to get study done. tat ?

## 2022-01-09 ENCOUNTER — Ambulatory Visit: Payer: BC Managed Care – PPO | Admitting: Urology

## 2022-01-13 ENCOUNTER — Ambulatory Visit: Payer: BC Managed Care – PPO | Admitting: Urology

## 2022-01-13 ENCOUNTER — Encounter: Payer: Self-pay | Admitting: Urology

## 2022-01-13 VITALS — BP 129/78 | HR 72 | Ht 69.75 in | Wt 215.0 lb

## 2022-01-13 DIAGNOSIS — N529 Male erectile dysfunction, unspecified: Secondary | ICD-10-CM

## 2022-01-13 DIAGNOSIS — E291 Testicular hypofunction: Secondary | ICD-10-CM

## 2022-01-13 MED ORDER — TESTOSTERONE CYPIONATE 200 MG/ML IM SOLN: 200.0000 mg | INTRAMUSCULAR | 0 refills | Status: DC

## 2022-01-13 MED ORDER — TADALAFIL 20 MG PO TABS: 20.0000 mg | ORAL_TABLET | Freq: Every day | ORAL | 0 refills | Status: DC | PRN

## 2022-01-13 NOTE — Progress Notes (Signed)
? ?01/13/2022 ?4:06 PM  ? ?Barth Kirks ?Dec 03, 1960 ?[id number] ? ?Referring provider: Mylinda Latina, PA-C ?[phone] ?[place],  [place] [address] ? ?Chief Complaint  ?Patient presents with  ? Hypogonadism  ? ? ?HPI: ?61 y.o. male presents for follow-up of hypogonadism. ? ?Initially seen 12/05/2020 with complaints of worsening ED, tiredness and fatigue with prior history of TRT ?Low T levels x2 and was started on testosterone cypionate injections ?He was to have a 6-week follow-up testosterone level however states his mother died unexpectedly approximately 2 weeks after beginning TRT and he discontinued replacement and was also concerned about the effect of testosterone on his prostate ?Scheduled for PSA with his PCP in the upcoming weeks ? ? ?PMH: ?Past Medical History:  ?Diagnosis Date  ? Hypertension   ? ? ?Surgical History: ?Past Surgical History:  ?Procedure Laterality Date  ? COLONOSCOPY    ? COLONOSCOPY WITH PROPOFOL N/A 04/10/2020  ? Procedure: COLONOSCOPY WITH PROPOFOL;  Surgeon: [name], MD;  Location: [facility] ENDOSCOPY;  Service: Gastroenterology;  Laterality: N/A;  ? ? ?Home Medications:  ?Allergies as of 01/13/2022   ?No Known Allergies ?  ? ?  ?Medication List  ?  ? ?  ? Accurate as of January 13, 2022  4:06 PM. If you have any questions, ask your nurse or doctor.  ?  ?  ? ?  ? ?B-D BLUNT FILL NEEDLE 18G X 1-1/2" Misc ?Generic drug: NEEDLE (DISP) 18 G ?1 each by Does not apply route every 14 (fourteen) days. ?  ?enalapril 5 MG tablet ?Commonly known as: VASOTEC ?TAKE 1 TABLET (5 MG TOTAL) BY MOUTH DAILY. ?  ?GLUCOSAMINE PO ?Take by mouth. ?  ?pantoprazole 40 MG tablet ?Commonly known as: PROTONIX ?Take 1 tablet (40 mg total) by mouth daily. ?  ?rosuvastatin 10 MG tablet ?Commonly known as: Crestor ?Take 1 tablet (10 mg total) by mouth daily. ?  ?tadalafil 20 MG tablet ?Commonly known as: CIALIS ?Take 1 tablet (20 mg total) by mouth daily as needed for erectile dysfunction. ?What changed: when to take  this ?Changed by: Abbie Sons, MD ?  ? ?  ? ? ?Allergies: No Known Allergies ? ?Family History: ?Family History  ?Problem Relation Age of Onset  ? Stroke Mother   ? Aneurysm Brother   ? ? ?Social History:  reports that he has quit smoking. His smoking use included cigarettes. He has never used smokeless tobacco. He reports current alcohol use. He reports that he does not use drugs. ? ? ?Physical Exam: ?BP 129/78   Pulse 72   Ht 5' 9.75" (1.772 m)   Wt 215 lb (97.5 kg)   BMI 31.07 kg/m?   ?Constitutional:  Alert and oriented, No acute distress. ?HEENT: [place] AT, moist mucus membranes.  Trachea midline, no masses. ?Respiratory: Normal respiratory effort, no increased work of breathing. ?GU: Prostate 40 g, smooth without nodules or induration.  Upper one half not palpable ?Skin: No rashes, bruises or suspicious lesions. ?Neurologic: Grossly intact, no focal deficits, moving all 4 extremities. ?Psychiatric: Normal mood and affect. ? ? ?Assessment & Plan:   ? ?1.  Hypogonadism ?Desires to restart TRT and Rx testosterone cypionate sent to pharmacy ?Follow-up midcycle testosterone level 6 weeks after starting ?We again reviewed potential side effects and the need for monitoring his PSA and hematocrit ? ?2.  Erectile dysfunction ?We discussed that ~ 70% of the ED cases are vascular in etiology and that testosterone replacement may not correct the ED to the  point that he does not need a PDE 5 inhibitor.  He asked about alcohol coronary artery disease with new onset ED and we did discuss this can be an early warning sign of subsequent coronary artery disease.  He was concerned and recommend he discuss with his PCP further evaluation including CT cardiac scoring. ?Tadalafil was refilled ? ? ?Abbie Sons, MD ? ?[facility] ?[address] ?[place], [place] [address] ?[phone] ? ?

## 2022-01-17 ENCOUNTER — Telehealth: Payer: Self-pay | Admitting: *Deleted

## 2022-01-17 NOTE — Telephone Encounter (Signed)
Testosterone  approved 01/16/2022-01/16/2023 ?

## 2022-02-12 ENCOUNTER — Other Ambulatory Visit: Payer: Self-pay

## 2022-02-12 DIAGNOSIS — E291 Testicular hypofunction: Secondary | ICD-10-CM

## 2022-02-18 ENCOUNTER — Other Ambulatory Visit: Payer: Self-pay | Admitting: Physician Assistant

## 2022-02-18 DIAGNOSIS — E782 Mixed hyperlipidemia: Secondary | ICD-10-CM

## 2022-03-03 ENCOUNTER — Other Ambulatory Visit: Payer: BC Managed Care – PPO

## 2022-03-11 ENCOUNTER — Other Ambulatory Visit: Payer: BC Managed Care – PPO

## 2022-03-11 ENCOUNTER — Other Ambulatory Visit: Payer: Self-pay

## 2022-03-11 DIAGNOSIS — E291 Testicular hypofunction: Secondary | ICD-10-CM | POA: Diagnosis not present

## 2022-03-12 LAB — TESTOSTERONE: Testosterone: 831 ng/dL (ref 264–916)

## 2022-03-13 ENCOUNTER — Other Ambulatory Visit: Payer: Self-pay | Admitting: Urology

## 2022-03-13 NOTE — Telephone Encounter (Signed)
Testosterone level looks good at 831.  Please check to see if his symptoms of tiredness and fatigue have improved.  Schedule follow-up office visit with testosterone, PSA, hematocrit prior.

## 2022-03-14 NOTE — Telephone Encounter (Signed)
Patient notified and states he is still having some fatigue. I informed him that he may need to see his PCP as his testosterone levels look good now. Appointment for follow up has been scheduled. Patient needs a refill on the testosterone medication.

## 2022-03-17 ENCOUNTER — Ambulatory Visit: Payer: BC Managed Care – PPO | Admitting: Physician Assistant

## 2022-03-17 ENCOUNTER — Other Ambulatory Visit: Payer: Self-pay | Admitting: Urology

## 2022-03-17 MED ORDER — TESTOSTERONE CYPIONATE 200 MG/ML IM SOLN: 200.0000 mg | INTRAMUSCULAR | 2 refills | Status: DC

## 2022-05-12 ENCOUNTER — Ambulatory Visit: Payer: BC Managed Care – PPO | Admitting: Family Medicine

## 2022-05-12 ENCOUNTER — Other Ambulatory Visit: Payer: Self-pay | Admitting: Family Medicine

## 2022-05-12 ENCOUNTER — Encounter: Payer: BC Managed Care – PPO | Admitting: Physician Assistant

## 2022-05-12 ENCOUNTER — Encounter: Payer: Self-pay | Admitting: Family Medicine

## 2022-05-12 VITALS — BP 114/71 | HR 71 | Ht 70.0 in | Wt 221.0 lb

## 2022-05-12 DIAGNOSIS — E7800 Pure hypercholesterolemia, unspecified: Secondary | ICD-10-CM | POA: Diagnosis not present

## 2022-05-12 DIAGNOSIS — Z6831 Body mass index (BMI) 31.0-31.9, adult: Secondary | ICD-10-CM

## 2022-05-12 DIAGNOSIS — K219 Gastro-esophageal reflux disease without esophagitis: Secondary | ICD-10-CM | POA: Diagnosis not present

## 2022-05-12 DIAGNOSIS — R7303 Prediabetes: Secondary | ICD-10-CM

## 2022-05-12 DIAGNOSIS — R351 Nocturia: Secondary | ICD-10-CM

## 2022-05-12 DIAGNOSIS — Z0000 Encounter for general adult medical examination without abnormal findings: Secondary | ICD-10-CM

## 2022-05-12 DIAGNOSIS — Z7689 Persons encountering health services in other specified circumstances: Secondary | ICD-10-CM | POA: Diagnosis not present

## 2022-05-12 DIAGNOSIS — E349 Endocrine disorder, unspecified: Secondary | ICD-10-CM | POA: Diagnosis not present

## 2022-05-12 DIAGNOSIS — I10 Essential (primary) hypertension: Secondary | ICD-10-CM

## 2022-05-12 DIAGNOSIS — Z125 Encounter for screening for malignant neoplasm of prostate: Secondary | ICD-10-CM

## 2022-05-12 DIAGNOSIS — Z1159 Encounter for screening for other viral diseases: Secondary | ICD-10-CM

## 2022-05-12 MED ORDER — PANTOPRAZOLE SODIUM 40 MG PO TBEC: 40.0000 mg | DELAYED_RELEASE_TABLET | Freq: Every day | ORAL | 3 refills | Status: DC

## 2022-05-12 NOTE — Patient Instructions (Addendum)
Thank you for coming to the office today.  For the [facility]) [place], [place] [address] Phone: [phone]  Stay tuned for apt from Dr Vicente Males or other colleague  I will re order your Pantoprazole protonix '40mg'$  daily, take daily before breakfast.  Hopefully this will help reduce the scratchy throat.  They can consider and plan for upper endoscopy.  ----  Keep track of BP, few times a week, bring in the readings, goal < 140 / 90  Consider the following  May start an anti histamine claritin zyrtec allegra etc - one of these, plus a nasal spray. Can do OTC or we can rx. If you think the allergies can contribute.  Other thought I am strongly leaning towards, discontinuing and changing the enalapril because it can cause a throat reaction. We will consider a next time.  Amlodipine 5-'10mg'$  is a good alternative.  Or we could do the Valsartan or Olmesartan which is related to enalapril but does not have the same side effect   DUE for FASTING BLOOD WORK (no food or drink after midnight before the lab appointment, only water or coffee without cream/sugar on the morning of)  SCHEDULE "Lab Only" visit in the morning at the clinic for lab draw in 3-4 weeks  - Make sure Lab Only appointment is at about 1 week before your next appointment, so that results will be available  For Lab Results, once available within 2-3 days of blood draw, you can can log in to MyChart online to view your results and a brief explanation. Also, we can discuss results at next follow-up visit.    Please schedule a Follow-up Appointment to: Return in about 3 weeks (around 06/02/2022) for 3-4 weeks fasting lab only then 1 week later Annual Physical.  If you have any other questions or concerns, please feel free to call the office or send a message through [place]. You may also schedule an earlier appointment if necessary.  Additionally, you may be  receiving a survey about your experience at our office within a few days to 1 week by e-mail or mail. We value your feedback.  Nobie Putnam, DO [place]

## 2022-05-12 NOTE — Progress Notes (Signed)
Subjective:    Patient ID: Fred Reeves, male    DOB: 1961/03/25, 61 y.o.   MRN: [id number]  Fred Reeves is a 61 y.o. male presenting on 05/12/2022 for Establish Care  Here to establish care.  HPI  Episodic Sore Scratchy Throat Reports for 6+ months, unsure precise onset, seems to be persistent problem. Not endorsing sinus symptoms  Hypogonadism Followed by [facility], seen recently 12/2021 by Dr Bernardo Heater He continues on testosterone injection  GERD Chronic problem with indigestion, acid reflux. He has taken Pantoprazole '40mg'$  daily PRN, not regularly but interested to resume.  Question headaches, tired, fatigued [facility], to evaluate possible [facility].  Questioning if Serving in [place] and if presumptive symptoms are related.  Right upper extremity pain He has R arm pain, fairly severe  Has Tizanidine PRN  CHRONIC HTN: Reports history of elevated BP at times. Questioned why this was fluctuating. He was recently placed on low dose med to help, he would still have some episodic elevated readings. Current Meds - Enalapril '5mg'$  daily   Reports good compliance, took meds today. Tolerating well, w/o complaints. Denies CP, dyspnea, HA, edema, dizziness / lightheadedness  HYPERLIPIDEMIA: - Reports no concerns. Last lipid panel 1 year ago - Currently taking Rosuvastatin '10mg'$ , tolerating well without side effects or myalgias   Health Maintenance: Future Shingles vaccine, when ready.  Hep C add?     05/12/2022   10:11 AM 12/16/2021    4:09 PM 08/12/2021    3:53 PM  Depression screen PHQ 2/9  Decreased Interest 0 0 0  Down, Depressed, Hopeless 0 0 0  PHQ - 2 Score 0 0 0  Altered sleeping 0    Tired, decreased energy 2    Change in appetite 0    Feeling bad or failure about yourself  0    Trouble concentrating 0    Moving slowly or fidgety/restless 0    Suicidal thoughts 0    PHQ-9 Score 2    Difficult doing work/chores Not difficult at all       Past Medical History:  Diagnosis Date   Hypertension    Past Surgical History:  Procedure Laterality Date   COLONOSCOPY     COLONOSCOPY WITH PROPOFOL N/A 04/10/2020   Procedure: COLONOSCOPY WITH PROPOFOL;  Surgeon: [name], MD;  Location: [facility] ENDOSCOPY;  Service: Gastroenterology;  Laterality: N/A;   Social History   Socioeconomic History   Marital status: Married    Spouse name: Not on file   Number of children: Not on file   Years of education: Not on file   Highest education level: Not on file  Occupational History   Not on file  Tobacco Use   Smoking status: Former    Types: Cigarettes   Smokeless tobacco: Never  Substance and Sexual Activity   Alcohol use: Yes    Alcohol/week: 1.0 - 2.0 standard drink of alcohol    Types: 1 - 2 Standard drinks or equivalent per week    Comment: moderate   Drug use: Never   Sexual activity: Not on file  Other Topics Concern   Not on file  Social History Narrative   Not on file   Social Determinants of Health   Financial Resource Strain: Not on file  Food Insecurity: Not on file  Transportation Needs: Not on file  Physical Activity: Not on file  Stress: Not on file  Social Connections: Not on file  Intimate Partner Violence: Not on  file   Family History  Problem Relation Age of Onset   Stroke Mother    Aneurysm Brother    Current Outpatient Medications on File Prior to Visit  Medication Sig   enalapril (VASOTEC) 5 MG tablet TAKE 1 TABLET (5 MG TOTAL) BY MOUTH DAILY.   Glucosamine HCl (GLUCOSAMINE PO) Take by mouth.   NEEDLE, DISP, 18 G (B-D BLUNT FILL NEEDLE) 18G X 1-1/2" MISC 1 each by Does not apply route every 14 (fourteen) days.   rosuvastatin (CRESTOR) 10 MG tablet TAKE 1 TABLET BY MOUTH EVERY DAY   tadalafil (CIALIS) 20 MG tablet Take 1 tablet (20 mg total) by mouth daily as needed for erectile dysfunction.   testosterone cypionate (DEPOTESTOSTERONE CYPIONATE) 200 MG/ML injection Inject 1 mL (200 mg total)  into the muscle every 14 (fourteen) days.   tiZANidine (ZANAFLEX) 2 MG tablet Take 2 mg by mouth at bedtime.   No current facility-administered medications on file prior to visit.    Review of Systems Per HPI unless specifically indicated above      Objective:    BP 114/71   Pulse 71   Ht '5\' 10"'$  (1.778 m)   Wt 221 lb (100.2 kg)   SpO2 97%   BMI 31.71 kg/m   Wt Readings from Last 3 Encounters:  05/12/22 221 lb (100.2 kg)  01/13/22 215 lb (97.5 kg)  12/16/21 222 lb 9.6 oz (101 kg)    Physical Exam Vitals and nursing note reviewed.  Constitutional:      General: He is not in acute distress.    Appearance: Normal appearance. He is well-developed. He is not diaphoretic.     Comments: Well-appearing, comfortable, cooperative  HENT:     Head: Normocephalic and atraumatic.  Eyes:     General:        Right eye: No discharge.        Left eye: No discharge.     Conjunctiva/sclera: Conjunctivae normal.  Cardiovascular:     Rate and Rhythm: Normal rate.  Pulmonary:     Effort: Pulmonary effort is normal.  Skin:    General: Skin is warm and dry.     Findings: No erythema or rash.  Neurological:     Mental Status: He is alert and oriented to person, place, and time.  Psychiatric:        Mood and Affect: Mood normal.        Behavior: Behavior normal.        Thought Content: Thought content normal.     Comments: Well groomed, good eye contact, normal speech and thoughts    Results for orders placed or performed in visit on 03/11/22  Testosterone  Result Value Ref Range   Testosterone 831 264 - 916 ng/dL      Assessment & Plan:   Problem List Items Addressed This Visit     Essential hypertension   Gastroesophageal reflux disease without esophagitis - Primary   Relevant Medications   pantoprazole (PROTONIX) 40 MG tablet   Other Relevant Orders   Ambulatory referral to Gastroenterology   Hypercholesterolemia   Testosterone deficiency   Other Visit Diagnoses      Encounter to establish care with new doctor       BMI 31.0-31.9,adult           Establish care  HTN BP elevated previously, episodic. He has some higher readings at times. Has been maintained on low dose Enalapril '5mg'$ , but still has higher readings. Additionally may have  some ACEi side effect with scratchy throat, suspicious timeline and persistent chronic problem with ACEi. Will monitor his BP for few weeks, follow labs and discuss alternative therapy CCB vs ARB upcoming visit.  HLD Previous lab 1 year ago Return for labs upcoming lipid panel Rosuvastatin '10mg'$  daily  GERD Re order Pantoprazole, recommend more regular dosing, may resolve the scratchy throat problem. If reflux related. Also will refer to GI for consult, may warrant future EGD for baseline and refractory symptoms  Additionally he has constellation of unexplained symptoms, question if [place] War Presumptive symptoms, will review further and may seek consult at [facility] This Encounter  Procedures   Ambulatory referral to Gastroenterology    Referral Priority:   Routine    Referral Type:   Consultation    Referral Reason:   Specialty Services Required    Number of Visits Requested:   1       Meds ordered this encounter  Medications   pantoprazole (PROTONIX) 40 MG tablet    Sig: Take 1 tablet (40 mg total) by mouth daily before breakfast.    Dispense:  90 tablet    Refill:  3      Follow up plan: Return in about 3 weeks (around 06/02/2022) for 3-4 weeks fasting lab only then 1 week later Annual Physical.  Future labs 06/17/22  Nobie Putnam, [facility] 05/12/2022, 10:18 AM

## 2022-05-22 ENCOUNTER — Encounter: Payer: BC Managed Care – PPO | Admitting: Physician Assistant

## 2022-06-09 ENCOUNTER — Other Ambulatory Visit: Payer: Self-pay | Admitting: Physician Assistant

## 2022-06-09 DIAGNOSIS — I10 Essential (primary) hypertension: Secondary | ICD-10-CM

## 2022-06-16 ENCOUNTER — Other Ambulatory Visit: Payer: Self-pay

## 2022-06-16 DIAGNOSIS — I10 Essential (primary) hypertension: Secondary | ICD-10-CM

## 2022-06-16 DIAGNOSIS — Z1159 Encounter for screening for other viral diseases: Secondary | ICD-10-CM

## 2022-06-16 DIAGNOSIS — Z6831 Body mass index (BMI) 31.0-31.9, adult: Secondary | ICD-10-CM

## 2022-06-16 DIAGNOSIS — Z0000 Encounter for general adult medical examination without abnormal findings: Secondary | ICD-10-CM

## 2022-06-16 DIAGNOSIS — R351 Nocturia: Secondary | ICD-10-CM

## 2022-06-16 DIAGNOSIS — E7800 Pure hypercholesterolemia, unspecified: Secondary | ICD-10-CM

## 2022-06-16 DIAGNOSIS — Z125 Encounter for screening for malignant neoplasm of prostate: Secondary | ICD-10-CM

## 2022-06-16 DIAGNOSIS — R7303 Prediabetes: Secondary | ICD-10-CM

## 2022-06-17 ENCOUNTER — Other Ambulatory Visit: Payer: BC Managed Care – PPO

## 2022-06-17 DIAGNOSIS — Z125 Encounter for screening for malignant neoplasm of prostate: Secondary | ICD-10-CM | POA: Diagnosis not present

## 2022-06-17 DIAGNOSIS — I10 Essential (primary) hypertension: Secondary | ICD-10-CM | POA: Diagnosis not present

## 2022-06-17 DIAGNOSIS — E7800 Pure hypercholesterolemia, unspecified: Secondary | ICD-10-CM | POA: Diagnosis not present

## 2022-06-17 DIAGNOSIS — Z1159 Encounter for screening for other viral diseases: Secondary | ICD-10-CM | POA: Diagnosis not present

## 2022-06-17 DIAGNOSIS — R7303 Prediabetes: Secondary | ICD-10-CM | POA: Diagnosis not present

## 2022-06-18 LAB — COMPLETE METABOLIC PANEL WITH GFR
AG Ratio: 1.5 (calc) (ref 1.0–2.5)
ALT: 9 U/L (ref 9–46)
AST: 14 U/L (ref 10–35)
Albumin: 4.1 g/dL (ref 3.6–5.1)
Alkaline phosphatase (APISO): 55 U/L (ref 35–144)
BUN: 21 mg/dL (ref 7–25)
CO2: 23 mmol/L (ref 20–32)
Calcium: 8.9 mg/dL (ref 8.6–10.3)
Chloride: 106 mmol/L (ref 98–110)
Creat: 1.22 mg/dL (ref 0.70–1.35)
Globulin: 2.7 g/dL (calc) (ref 1.9–3.7)
Glucose, Bld: 95 mg/dL (ref 65–99)
Potassium: 4.2 mmol/L (ref 3.5–5.3)
Sodium: 139 mmol/L (ref 135–146)
Total Bilirubin: 0.4 mg/dL (ref 0.2–1.2)
Total Protein: 6.8 g/dL (ref 6.1–8.1)
eGFR: 67 mL/min/{1.73_m2} (ref >=60)

## 2022-06-18 LAB — CBC WITH DIFFERENTIAL/PLATELET
Absolute Monocytes: 509 cells/uL (ref 200–950)
Basophils Absolute: 37 cells/uL (ref 0–200)
Basophils Relative: 0.7 %
Eosinophils Absolute: 180 cells/uL (ref 15–500)
Eosinophils Relative: 3.4 %
HCT: 50.8 % — ABNORMAL HIGH (ref 38.5–50.0)
Hemoglobin: 16 g/dL (ref 13.2–17.1)
Lymphs Abs: 1834 cells/uL (ref 850–3900)
MCH: 26.4 pg — ABNORMAL LOW (ref 27.0–33.0)
MCHC: 31.5 g/dL — ABNORMAL LOW (ref 32.0–36.0)
MCV: 84 fL (ref 80.0–100.0)
MPV: 10.3 fL (ref 7.5–12.5)
Monocytes Relative: 9.6 %
Neutro Abs: 2740 cells/uL (ref 1500–7800)
Neutrophils Relative %: 51.7 %
Platelets: 199 10*3/uL (ref 140–400)
RBC: 6.05 10*6/uL — ABNORMAL HIGH (ref 4.20–5.80)
RDW: 12.9 % (ref 11.0–15.0)
Total Lymphocyte: 34.6 %
WBC: 5.3 10*3/uL (ref 3.8–10.8)

## 2022-06-18 LAB — LIPID PANEL
Cholesterol: 154 mg/dL (ref <200)
HDL: 59 mg/dL (ref >=40)
LDL Cholesterol (Calc): 79 mg/dL (calc)
Non-HDL Cholesterol (Calc): 95 mg/dL (calc) (ref <130)
Total CHOL/HDL Ratio: 2.6 (calc) (ref <5.0)
Triglycerides: 84 mg/dL (ref <150)

## 2022-06-18 LAB — HEMOGLOBIN A1C
Hgb A1c MFr Bld: 6 % of total Hgb — ABNORMAL HIGH (ref <5.7)
Mean Plasma Glucose: 126 mg/dL
eAG (mmol/L): 7 mmol/L

## 2022-06-18 LAB — PSA: PSA: 0.93 ng/mL (ref <=4.00)

## 2022-06-18 LAB — TSH: TSH: 2.29 mIU/L (ref 0.40–4.50)

## 2022-06-18 LAB — HEPATITIS C ANTIBODY: Hepatitis C Ab: NONREACTIVE

## 2022-06-24 ENCOUNTER — Encounter: Payer: Self-pay | Admitting: Family Medicine

## 2022-06-24 ENCOUNTER — Ambulatory Visit (INDEPENDENT_AMBULATORY_CARE_PROVIDER_SITE_OTHER): Payer: BC Managed Care – PPO | Admitting: Family Medicine

## 2022-06-24 VITALS — BP 124/78 | HR 71 | Ht 70.0 in | Wt 219.6 lb

## 2022-06-24 DIAGNOSIS — Z0000 Encounter for general adult medical examination without abnormal findings: Secondary | ICD-10-CM

## 2022-06-24 DIAGNOSIS — E7800 Pure hypercholesterolemia, unspecified: Secondary | ICD-10-CM | POA: Diagnosis not present

## 2022-06-24 DIAGNOSIS — I10 Essential (primary) hypertension: Secondary | ICD-10-CM

## 2022-06-24 DIAGNOSIS — R7303 Prediabetes: Secondary | ICD-10-CM

## 2022-06-24 DIAGNOSIS — K219 Gastro-esophageal reflux disease without esophagitis: Secondary | ICD-10-CM

## 2022-06-24 MED ORDER — AMLODIPINE BESYLATE 5 MG PO TABS: 5.0000 mg | ORAL_TABLET | Freq: Every day | ORAL | 3 refills | Status: DC

## 2022-06-24 NOTE — Progress Notes (Unsigned)
Subjective:    Patient ID: Makoa Satz, male    DOB: [birth year], 61 y.o.   MRN: [id number]  Diontay Rosencrans is a 62 y.o. male presenting on 06/24/2022 for Annual Exam   HPI  Here for Annual Physical and Lab Review.  followed by orthopedic, on muscle relaxant  Episodic Sore Scratchy Throat Reports for 6+ months, unsure precise onset, seems to be persistent problem. Not endorsing sinus symptoms On ACEi Still bothering him despite PPI therapy   Hypogonadism Followed by [facility], seen recently 12/2021 by Dr Bernardo Heater He continues on testosterone injection   GERD Chronic problem with indigestion, acid reflux. He has taken Pantoprazole 40mg  daily PRN, not regularly but interested to resume.  Pre Diabetes A1c 6.0, previously 5.8 to 6.3, now improved. Goal to focus on diet   Question headaches, tired, fatigued [facility], to evaluate possible sleep study. Questioning if Serving in [place] and if [place] Presumptive symptoms are related or linked   Right upper extremity pain He has R arm pain, fairly severe  Has Tizanidine PRN   CHRONIC HTN: Reports history of elevated BP at times. Questioned why this was fluctuating. He was recently placed on low dose med to help, he would still have some episodic elevated readings. Current Meds - Enalapril 5mg  daily   Reports good compliance, took meds today. Tolerating well, w/o complaints. Denies CP, dyspnea, HA, edema, dizziness / lightheadedness   HYPERLIPIDEMIA: - Reports no concerns. Last lipid panel 1 year ago - Currently taking Rosuvastatin 10mg , tolerating well without side effects or myalgias  Hypogonadism On Testosterone followed by Urology  Mild elevated HCT 50.8, normal Hgb Former smoker but years ago long since quit.   Health Maintenance: Future Shingles vaccine, when ready.  PSA 0.93 negative.   SCreening Hepatitis C negative  Flu Shot when ready, return  Colonoscopy last done 04/10/20 by Dr Vicente Males  ([facility]), repeat 5 years, 2026.     05/12/2022   10:11 AM 12/16/2021    4:09 PM 08/12/2021    3:53 PM  Depression screen PHQ 2/9  Decreased Interest 0 0 0  Down, Depressed, Hopeless 0 0 0  PHQ - 2 Score 0 0 0  Altered sleeping 0    Tired, decreased energy 2    Change in appetite 0    Feeling bad or failure about yourself  0    Trouble concentrating 0    Moving slowly or fidgety/restless 0    Suicidal thoughts 0    PHQ-9 Score 2    Difficult doing work/chores Not difficult at all      Past Medical History:  Diagnosis Date   Hypertension    Past Surgical History:  Procedure Laterality Date   COLONOSCOPY     COLONOSCOPY WITH PROPOFOL N/A 04/10/2020   Procedure: COLONOSCOPY WITH PROPOFOL;  Surgeon: [name], MD;  Location: [facility] ENDOSCOPY;  Service: Gastroenterology;  Laterality: N/A;   Social History   Socioeconomic History   Marital status: Married    Spouse name: Not on file   Number of children: Not on file   Years of education: Not on file   Highest education level: Not on file  Occupational History   Not on file  Tobacco Use   Smoking status: Former    Packs/day: 0.50    Years: 10.00    Total pack years: 5.00    Types: Cigarettes    Start date: 29    Quit date: 44  Years since quitting: 33.7   Smokeless tobacco: Never   Tobacco comments:    Intermittent smoking only, not consistency  Substance and Sexual Activity   Alcohol use: Yes    Alcohol/week: 1.0 - 2.0 standard drink of alcohol    Types: 1 - 2 Standard drinks or equivalent per week    Comment: moderate   Drug use: Never   Sexual activity: Not on file  Other Topics Concern   Not on file  Social History Narrative   Not on file   Social Determinants of Health   Financial Resource Strain: Not on file  Food Insecurity: Not on file  Transportation Needs: Not on file  Physical Activity: Not on file  Stress: Not on file  Social Connections: Not on file  Intimate Partner Violence: Not  on file   Family History  Problem Relation Age of Onset   Stroke Mother    Aneurysm Brother    Current Outpatient Medications on File Prior to Visit  Medication Sig   Glucosamine HCl (GLUCOSAMINE PO) Take by mouth.   NEEDLE, DISP, 18 G (B-D BLUNT FILL NEEDLE) 18G X 1-1/2" MISC 1 each by Does not apply route every 14 (fourteen) days.   pantoprazole (PROTONIX) 40 MG tablet Take 1 tablet (40 mg total) by mouth daily before breakfast.   rosuvastatin (CRESTOR) 10 MG tablet TAKE 1 TABLET BY MOUTH EVERY DAY   tadalafil (CIALIS) 20 MG tablet Take 1 tablet (20 mg total) by mouth daily as needed for erectile dysfunction.   testosterone cypionate (DEPOTESTOSTERONE CYPIONATE) 200 MG/ML injection Inject 1 mL (200 mg total) into the muscle every 14 (fourteen) days.   tiZANidine (ZANAFLEX) 2 MG tablet Take 2 mg by mouth at bedtime.   No current facility-administered medications on file prior to visit.    Review of Systems  Constitutional:  Negative for activity change, appetite change, chills, diaphoresis, fatigue and fever.  HENT:  Negative for congestion and hearing loss.   Eyes:  Negative for visual disturbance.  Respiratory:  Negative for cough, chest tightness, shortness of breath and wheezing.   Cardiovascular:  Negative for chest pain, palpitations and leg swelling.  Gastrointestinal:  Negative for abdominal pain, constipation, diarrhea, nausea and vomiting.  Genitourinary:  Negative for dysuria, frequency and hematuria.  Musculoskeletal:  Negative for arthralgias and neck pain.  Skin:  Negative for rash.  Neurological:  Negative for dizziness, weakness, light-headedness, numbness and headaches.  Hematological:  Negative for adenopathy.  Psychiatric/Behavioral:  Negative for behavioral problems, dysphoric mood and sleep disturbance.    Per HPI unless specifically indicated above      Objective:    BP 124/78   Pulse 71   Ht $R'5\' 10"'dv$  (1.778 m)   Wt 219 lb 9.6 oz (99.6 kg)   SpO2 97%    BMI 31.51 kg/m   Wt Readings from Last 3 Encounters:  06/24/22 219 lb 9.6 oz (99.6 kg)  05/12/22 221 lb (100.2 kg)  01/13/22 215 lb (97.5 kg)    Physical Exam Vitals and nursing note reviewed.  Constitutional:      General: He is not in acute distress.    Appearance: He is well-developed. He is not diaphoretic.     Comments: Well-appearing, comfortable, cooperative  HENT:     Head: Normocephalic and atraumatic.  Eyes:     General:        Right eye: No discharge.        Left eye: No discharge.     Conjunctiva/sclera:  Conjunctivae normal.     Pupils: Pupils are equal, round, and reactive to light.  Neck:     Thyroid: No thyromegaly.     Vascular: No carotid bruit.  Cardiovascular:     Rate and Rhythm: Normal rate and regular rhythm.     Pulses: Normal pulses.     Heart sounds: Normal heart sounds. No murmur heard. Pulmonary:     Effort: Pulmonary effort is normal. No respiratory distress.     Breath sounds: Normal breath sounds. No wheezing or rales.  Abdominal:     General: Bowel sounds are normal. There is no distension.     Palpations: Abdomen is soft. There is no mass.     Tenderness: There is no abdominal tenderness.  Musculoskeletal:        General: No tenderness. Normal range of motion.     Cervical back: Normal range of motion and neck supple.     Right lower leg: No edema.     Left lower leg: No edema.     Comments: Upper / Lower Extremities: - Normal muscle tone, strength bilateral upper extremities 5/5, lower extremities 5/5  Lymphadenopathy:     Cervical: No cervical adenopathy.  Skin:    General: Skin is warm and dry.     Findings: No erythema or rash.  Neurological:     Mental Status: He is alert and oriented to person, place, and time.     Comments: Distal sensation intact to light touch all extremities  Psychiatric:        Mood and Affect: Mood normal.        Behavior: Behavior normal.        Thought Content: Thought content normal.     Comments:  Well groomed, good eye contact, normal speech and thoughts      Results for orders placed or performed in visit on 06/16/22  Hepatitis C antibody  Result Value Ref Range   Hepatitis C Ab NON-REACTIVE NON-REACTIVE  TSH  Result Value Ref Range   TSH 2.29 0.40 - 4.50 mIU/L  PSA  Result Value Ref Range   PSA 0.93 < OR = 4.00 ng/mL  Hemoglobin A1c  Result Value Ref Range   Hgb A1c MFr Bld 6.0 (H) <5.7 % of total Hgb   Mean Plasma Glucose 126 mg/dL   eAG (mmol/L) 7.0 mmol/L  Lipid panel  Result Value Ref Range   Cholesterol 154 <200 mg/dL   HDL 59 > OR = 40 mg/dL   Triglycerides 84 <150 mg/dL   LDL Cholesterol (Calc) 79 mg/dL (calc)   Total CHOL/HDL Ratio 2.6 <5.0 (calc)   Non-HDL Cholesterol (Calc) 95 <130 mg/dL (calc)  CBC with Differential/Platelet  Result Value Ref Range   WBC 5.3 3.8 - 10.8 Thousand/uL   RBC 6.05 (H) 4.20 - 5.80 Million/uL   Hemoglobin 16.0 13.2 - 17.1 g/dL   HCT 50.8 (H) 38.5 - 50.0 %   MCV 84.0 80.0 - 100.0 fL   MCH 26.4 (L) 27.0 - 33.0 pg   MCHC 31.5 (L) 32.0 - 36.0 g/dL   RDW 12.9 11.0 - 15.0 %   Platelets 199 140 - 400 Thousand/uL   MPV 10.3 7.5 - 12.5 fL   Neutro Abs 2,740 1,500 - 7,800 cells/uL   Lymphs Abs 1,834 850 - 3,900 cells/uL   Absolute Monocytes 509 200 - 950 cells/uL   Eosinophils Absolute 180 15 - 500 cells/uL   Basophils Absolute 37 0 - 200 cells/uL   Neutrophils Relative %  51.7 %   Total Lymphocyte 34.6 %   Monocytes Relative 9.6 %   Eosinophils Relative 3.4 %   Basophils Relative 0.7 %  COMPLETE METABOLIC PANEL WITH GFR  Result Value Ref Range   Glucose, Bld 95 65 - 99 mg/dL   BUN 21 7 - 25 mg/dL   Creat 1.22 0.70 - 1.35 mg/dL   eGFR 67 > OR = 60 mL/min/1.37m2   BUN/Creatinine Ratio SEE NOTE: 6 - 22 (calc)   Sodium 139 135 - 146 mmol/L   Potassium 4.2 3.5 - 5.3 mmol/L   Chloride 106 98 - 110 mmol/L   CO2 23 20 - 32 mmol/L   Calcium 8.9 8.6 - 10.3 mg/dL   Total Protein 6.8 6.1 - 8.1 g/dL   Albumin 4.1 3.6 - 5.1 g/dL    Globulin 2.7 1.9 - 3.7 g/dL (calc)   AG Ratio 1.5 1.0 - 2.5 (calc)   Total Bilirubin 0.4 0.2 - 1.2 mg/dL   Alkaline phosphatase (APISO) 55 35 - 144 U/L   AST 14 10 - 35 U/L   ALT 9 9 - 46 U/L      Assessment & Plan:   Problem List Items Addressed This Visit     Essential hypertension   Relevant Medications   amLODipine (NORVASC) 5 MG tablet   Gastroesophageal reflux disease without esophagitis   Hypercholesterolemia   Relevant Medications   amLODipine (NORVASC) 5 MG tablet   Other Visit Diagnoses     Annual physical exam    -  Primary   Pre-diabetes           Updated Health Maintenance information Reviewed recent lab results with patient Encouraged improvement to lifestyle with diet and exercise Goal of weight loss  Possible ACEi throat symptoms Stop Enalapril $RemoveBefore'5mg'[id number]$  daily. Start Amlodipine $RemoveBeforeDE'5mg'[id number]$  daily, may rarely cause lower extremity edema swelling.  I recommend Coronary Calcium Scoring CT test for the heart, to check if any blockages. We can arrange through the hospital [facility]. CT scan costs $100. If it shows an issue, they will pursue further evaluation and testing from Cardiologist.  --------------------  Future for prostate urinary symptoms - I would recommend Saw Palmetto or prostate supplement and we can order Flomax in future.  ----------------  Mild elevated HCT 50.8, this is due to testosterone medication, please discuss further with Dr Bernardo Heater Urology   Return in a week for the flu shot and shingles round #1, and would need a 2nd shingles dose 2-6 months.  GI options to look into to see if sooner apt for Upper Endoscopy - or keep scheduled apt   Meds ordered this encounter  Medications   amLODipine (NORVASC) 5 MG tablet    Sig: Take 1 tablet (5 mg total) by mouth daily.    Dispense:  90 tablet    Refill:  3     Follow up plan: Return in about 6 months (around 12/23/2022) for return on Fri 10/6 for Nurse Visit for Shingles / Flu Shot - 6 month  follow-up PreDM A1c, HTN.  Nobie Putnam, [facility] 06/24/2022, 4:19 PM

## 2022-06-24 NOTE — Patient Instructions (Addendum)
Thank you for coming to the office today.   Stop Enalapril '5mg'$  daily. Start Amlodipine '5mg'$  daily, may rarely cause lower extremity edema swelling.   I recommend Coronary Calcium Scoring CT test for the heart, to check if any blockages. We can arrange through the hospital [facility]. CT scan costs $100. If it shows an issue, they will pursue further evaluation and testing from Cardiologist.  --------------------  Future for prostate urinary symptoms - I would recommend Saw Palmetto or prostate supplement and we can order Flomax in future.  ----------------  Mild elevated HCT 50.8, this is due to testosterone medication, please discuss further with Dr Bernardo Heater Urology   Return in a week for the flu shot and shingles round #1, and would need a 2nd shingles dose 2-6 months.  GI options to look into to see if sooner apt for Upper Endoscopy  Check into these for new patient availability for eval and procedure EGD, and if need I can change the referral.  [facility]: Robet Leu MD Gastroenterologist in [place], [place] Address: [address] [place], [place], [place] [address] Phone: [phone]  [facility] Located in: [facility] Address: [address] [place], [place], [place] [address] Phone: [phone]   [facility] Located in: [facility] Address: [address] [place], [place], [place] [address] Phone: [phone]   Please schedule a Follow-up Appointment to: Return in about 6 months (around 12/23/2022) for return on Fri 10/6 for Nurse Visit for Shingles / Flu Shot - 6 month follow-up PreDM A1c, HTN.  If you have any other questions or concerns, please feel free to call the office or send a message through [place]. You may also schedule an earlier appointment if necessary.  Additionally, you may be receiving a survey about your experience at our office within a few  days to 1 week by e-mail or mail. We value your feedback.  Nobie Putnam, DO [place]

## 2022-07-04 ENCOUNTER — Ambulatory Visit (INDEPENDENT_AMBULATORY_CARE_PROVIDER_SITE_OTHER): Payer: BC Managed Care – PPO

## 2022-07-04 DIAGNOSIS — Z23 Encounter for immunization: Secondary | ICD-10-CM

## 2022-07-14 ENCOUNTER — Other Ambulatory Visit: Payer: BC Managed Care – PPO

## 2022-07-16 ENCOUNTER — Other Ambulatory Visit: Payer: Self-pay | Admitting: *Deleted

## 2022-07-16 ENCOUNTER — Other Ambulatory Visit: Payer: BC Managed Care – PPO

## 2022-07-16 ENCOUNTER — Other Ambulatory Visit: Payer: Self-pay

## 2022-07-16 DIAGNOSIS — R972 Elevated prostate specific antigen [PSA]: Secondary | ICD-10-CM | POA: Diagnosis not present

## 2022-07-16 DIAGNOSIS — E349 Endocrine disorder, unspecified: Secondary | ICD-10-CM

## 2022-07-16 DIAGNOSIS — E291 Testicular hypofunction: Secondary | ICD-10-CM | POA: Diagnosis not present

## 2022-07-18 LAB — TESTOSTERONE: Testosterone: 588 ng/dL (ref 264–916)

## 2022-07-18 LAB — PSA: Prostate Specific Ag, Serum: 1.1 ng/mL (ref 0.0–4.0)

## 2022-07-18 LAB — HEMATOCRIT: Hematocrit: 48.1 % (ref 37.5–51.0)

## 2022-07-23 ENCOUNTER — Ambulatory Visit: Payer: BC Managed Care – PPO | Admitting: Urology

## 2022-07-23 ENCOUNTER — Encounter: Payer: Self-pay | Admitting: Urology

## 2022-07-23 VITALS — BP 115/69 | HR 86 | Ht 70.0 in | Wt 217.0 lb

## 2022-07-23 DIAGNOSIS — N529 Male erectile dysfunction, unspecified: Secondary | ICD-10-CM | POA: Diagnosis not present

## 2022-07-23 DIAGNOSIS — E291 Testicular hypofunction: Secondary | ICD-10-CM | POA: Diagnosis not present

## 2022-07-23 DIAGNOSIS — E349 Endocrine disorder, unspecified: Secondary | ICD-10-CM

## 2022-07-23 MED ORDER — SILDENAFIL CITRATE 100 MG PO TABS: ORAL_TABLET | ORAL | 0 refills | Status: DC

## 2022-07-23 NOTE — Progress Notes (Signed)
07/23/2022 3:36 PM   Fred Reeves [birth year] [id number]  Referring provider: Mylinda Latina, PA-C [place],  [place] [address]  Chief Complaint  Patient presents with   Hypogonadism    HPI:  TRT started 01/13/2022; symptoms tiredness, fatigue, ED Injecting 200 mg every 2 weeks Labs 07/16/2022: Testosterone 588; PSA 1.1; HCT 48.1 Energy level has improved though still having ED.  He is taking 10 mg tadalafil 4 times weekly He also wanted to know if he would need to be on TRT lifelong  PMH: Past Medical History:  Diagnosis Date   Hypertension     Surgical History: Past Surgical History:  Procedure Laterality Date   COLONOSCOPY     COLONOSCOPY WITH PROPOFOL N/A 04/10/2020   Procedure: COLONOSCOPY WITH PROPOFOL;  Surgeon: [name], MD;  Location: [facility] END[facility]COPY;  Service: Gastroenterology;  Laterality: N/A;    Home Medications:  Allergies as of 07/23/2022   No Known Allergies      Medication List        Accurate as of July 23, 2022  3:36 PM. If you have any questions, ask your nurse or doctor.          amLODipine 5 MG tablet Commonly known as: NORVASC Take 1 tablet (5 mg total) by mouth daily.   B-D BLUNT FILL NEEDLE 18G X 1-1/2" Misc Generic drug: NEEDLE (DISP) 18 G 1 each by Does not apply route every 14 (fourteen) days.   GLUCOSAMINE PO Take by mouth.   pantoprazole 40 MG tablet Commonly known as: PROTONIX Take 1 tablet (40 mg total) by mouth daily before breakfast.   rosuvastatin 10 MG tablet Commonly known as: CRESTOR TAKE 1 TABLET BY MOUTH EVERY DAY   tadalafil 20 MG tablet Commonly known as: CIALIS Take 1 tablet (20 mg total) by mouth daily as needed for erectile dysfunction.   testosterone cypionate 200 MG/ML injection Commonly known as: DEPOTESTOSTERONE CYPIONATE Inject 1 mL (200 mg total) into the muscle every 14 (fourteen) days.   tiZANidine 2 MG tablet Commonly known as: ZANAFLEX Take 2 mg by mouth at  bedtime.        Allergies: No Known Allergies  Family History: Family History  Problem Relation Age of Onset   Stroke Mother    Aneurysm Brother     Social History:  reports that he quit smoking about 33 years ago. His smoking use included cigarettes. He started smoking about 49 years ago. He has a 5.00 pack-year smoking history. He has never used smokeless tobacco. He reports current alcohol use of about 1.0 - 2.0 standard drink of alcohol per week. He reports that he does not use drugs.   Physical Exam: BP 115/69   Pulse 86   Ht '5\' 10"'$  (1.778 m)   Wt 217 lb (98.4 kg)   BMI 31.14 kg/m   Constitutional:  Alert and oriented, No acute distress. HEENT: [place] AT Respiratory: Normal respiratory effort, no increased work of breathing. Psychiatric: Normal mood and affect.   Assessment & Plan:    1.  Hypogonadism Improvement in energy level on TRT Discussed that exogenous testosterone decreases natural testosterone production and he would most likely need to be on long-term therapy His LH was 4.1 and we discussed the option of Clomid which stimulates testicular testosterone production.  He may want to switch to Clomid and will call back if desired 34-monthlab visits and 1 year follow-up appointment scheduled If he does wish to Clomid will check a testosterone level  6 weeks after starting therapy  2.  Erectile dysfunction Recommend he increase the tadalafil to 20 mg We also discussed a trial of sildenafil.  He did request Rx sildenafil be sent to pharmacy Intracavernosal injections were also discussed   Abbie Sons, [place] [address], [place], [place] [address] [phone]

## 2022-07-25 ENCOUNTER — Encounter: Payer: Self-pay | Admitting: Urology

## 2022-09-08 ENCOUNTER — Other Ambulatory Visit: Payer: Self-pay | Admitting: Urology

## 2022-09-15 ENCOUNTER — Other Ambulatory Visit: Payer: Self-pay | Admitting: Internal Medicine

## 2022-09-15 DIAGNOSIS — E782 Mixed hyperlipidemia: Secondary | ICD-10-CM

## 2022-10-01 ENCOUNTER — Encounter: Payer: Self-pay | Admitting: Gastroenterology

## 2022-10-01 ENCOUNTER — Other Ambulatory Visit: Payer: Self-pay

## 2022-10-01 ENCOUNTER — Ambulatory Visit: Payer: BC Managed Care – PPO | Admitting: Gastroenterology

## 2022-10-01 VITALS — BP 143/88 | HR 88 | Temp 98.7°F | Ht 69.25 in | Wt 225.0 lb

## 2022-10-01 DIAGNOSIS — K5909 Other constipation: Secondary | ICD-10-CM | POA: Diagnosis not present

## 2022-10-01 DIAGNOSIS — K219 Gastro-esophageal reflux disease without esophagitis: Secondary | ICD-10-CM

## 2022-10-01 MED ORDER — PANTOPRAZOLE SODIUM 40 MG PO TBEC: 40.0000 mg | DELAYED_RELEASE_TABLET | Freq: Two times a day (BID) | ORAL | 1 refills | Status: AC

## 2022-10-01 NOTE — Progress Notes (Signed)
[name] MD, MRCP(U.K) [address]  [place]  [place], [place] [address]  Main: [phone]  Fax: [phone]   Gastroenterology Consultation  Referring Provider:     Mylinda Latina, PA* Primary Care Physician:  Olin Hauser, DO Primary Gastroenterologist:  Dr. [name]  Reason for Consultation:     GERD        HPI:   Fred Reeves is a 62 y.o. y/o male referred for consultation & management  by Dr. Parks Ranger, Devonne Doughty, DO.     Reflux:  Onset : Years Symptoms: Heartburn, regurgitation, sour taste in his mouth Recent weight gain: Yes Medications: Protonix possibly 40 mg once a day first thing in the morning on empty stomach Narcotics or anticholinergics use : None PPI /H2 blockers or Antacid  use and timing : As above Prior EGD: None Family history of esophageal cancer: None In addition also has constipation occasionally he states he has had a colonoscopy recently and not due for one for 5 years    Past Medical History:  Diagnosis Date   Hypertension     Past Surgical History:  Procedure Laterality Date   COLONOSCOPY     COLONOSCOPY WITH PROPOFOL N/A 04/10/2020   Procedure: COLONOSCOPY WITH PROPOFOL;  Surgeon: [name], MD;  Location: [facility] ENDOSCOPY;  Service: Gastroenterology;  Laterality: N/A;    Prior to Admission medications   Medication Sig Start Date End Date Taking? Authorizing Provider  amLODipine (NORVASC) 5 MG tablet Take 1 tablet (5 mg total) by mouth daily. 06/24/22   Karamalegos, Devonne Doughty, DO  Glucosamine HCl (GLUCOSAMINE PO) Take by mouth.    [provider]  NEEDLE, DISP, 18 G (B-D BLUNT FILL NEEDLE) 18G X 1-1/2" MISC 1 each by Does not apply route every 14 (fourteen) days. 12/18/20   Stoioff, Ronda Fairly, MD  pantoprazole (PROTONIX) 40 MG tablet Take 1 tablet (40 mg total) by mouth daily before breakfast. 05/12/22   Parks Ranger, Devonne Doughty, DO  rosuvastatin (CRESTOR) 10 MG tablet TAKE 1 TABLET BY MOUTH  EVERY DAY 02/18/22   Lavera Guise, MD  sildenafil (VIAGRA) 100 MG tablet 1 tablet by mouth 1 hour prior to intercourse 07/23/22   Stoioff, Ronda Fairly, MD  tadalafil (CIALIS) 20 MG tablet Take 1 tablet (20 mg total) by mouth daily as needed for erectile dysfunction. 01/13/22   Stoioff, Ronda Fairly, MD  testosterone cypionate (DEPOTESTOSTERONE CYPIONATE) 200 MG/ML injection INJECT 1 ML (200 MG TOTAL) INTO THE MUSCLE EVERY 14 DAYS 09/10/22   Stoioff, Ronda Fairly, MD  tiZANidine (ZANAFLEX) 2 MG tablet Take 2 mg by mouth at bedtime. 05/10/22   [provider]    Family History  Problem Relation Age of Onset   Stroke Mother    Aneurysm Brother      Social History   Tobacco Use   Smoking status: Former    Packs/day: 0.50    Years: 10.00    Total pack years: 5.00    Types: Cigarettes    Start date: 69    Quit date: 1990    Years since quitting: 34.0   Smokeless tobacco: Never   Tobacco comments:    Intermittent smoking only, not consistency  Substance Use Topics   Alcohol use: Yes    Alcohol/week: 1.0 - 2.0 standard drink of alcohol    Types: 1 - 2 Standard drinks or equivalent per week    Comment: moderate   Drug use: Never    Allergies as  of 10/01/2022   (No Known Allergies)    Review of Systems:    All systems reviewed and negative except where noted in HPI.   Physical Exam:  BP (!) 143/88   Pulse 88   Temp 98.7 F (37.1 C) (Oral)   Ht 5' 9.25" (1.759 m)   Wt 225 lb (102.1 kg)   BMI 32.99 kg/m  No LMP for male patient. Psych:  Alert and cooperative. Normal mood and affect. General:   Alert,  Well-developed, well-nourished, pleasant and cooperative in NAD Head:  Normocephalic and atraumatic. Eyes:  Sclera clear, no icterus.   Conjunctiva pink. Ears:  Normal auditory acuity. Neurologic:  Alert and oriented x3;  grossly normal neurologically. Psych:  Alert and cooperative. Normal mood and affect.  Imaging Studies: No results found.  Assessment and Plan:   Fred Reeves is a 62 y.o. y/o male has been referred for GERD.    Plan  GERD : Counseled on life style changes, suggest to use PPI first thing in the morning on empty stomach and eat 30 minutes after. Advised on the use of a wedge pillow at night , avoid meals for 2 hours prior to bed time. Weight loss .Discussed the risks and benefits of long term PPI use including but not limited to bone loss, chronic kidney disease, infections , low magnesium . Aim to use at the lowest dose for the shortest period of time will increase to Protonix 40 mg twice daily at next visit will reduce the dose if lifestyle changes have helped MiraLAX for constipation 1 cap daily can increase up to 2 times a day I have discussed alternative options, risks & benefits,  which include, but are not limited to, bleeding, infection, perforation,respiratory complication & drug reaction.  The patient agrees with this plan & written consent will be obtained.      Follow up in 3 months  Dr [name] MD,MRCP(U.K)

## 2022-10-01 NOTE — Addendum Note (Signed)
Addended by: Wayna Chalet on: 10/01/2022 01:49 PM   Modules accepted: Orders

## 2022-10-01 NOTE — Patient Instructions (Addendum)
Please purchase a wedge pillow to help you with your acid reflux.  Food Choices for Gastroesophageal Reflux Disease, Adult When you have gastroesophageal reflux disease (GERD), the foods you eat and your eating habits are very important. Choosing the right foods can help ease your discomfort. Think about working with a food expert (dietitian) to help you make good choices. What are tips for following this plan? Reading food labels Look for foods that are low in saturated fat. Foods that may help with your symptoms include: Foods that have less than 5% of daily value (DV) of fat. Foods that have 0 grams of trans fat. Cooking Do not fry your food. Cook your food by baking, steaming, grilling, or broiling. These are all methods that do not need a lot of fat for cooking. To add flavor, try to use herbs that are low in spice and acidity. Meal planning  Choose healthy foods that are low in fat, such as: Fruits and vegetables. Whole grains. Low-fat dairy products. Lean meats, fish, and poultry. Eat small meals often instead of eating 3 large meals each day. Eat your meals slowly in a place where you are relaxed. Avoid bending over or lying down until 2-3 hours after eating. Limit high-fat foods such as fatty meats or fried foods. Limit your intake of fatty foods, such as oils, butter, and shortening. Avoid the following as told by your doctor: Foods that cause symptoms. These may be different for different people. Keep a food diary to keep track of foods that cause symptoms. Alcohol. Drinking a lot of liquid with meals. Eating meals during the 2-3 hours before bed. Lifestyle Stay at a healthy weight. Ask your doctor what weight is healthy for you. If you need to lose weight, work with your doctor to do so safely. Exercise for at least 30 minutes on 5 or more days each week, or as told by your doctor. Wear loose-fitting clothes. Do not smoke or use any products that contain nicotine or  tobacco. If you need help quitting, ask your doctor. Sleep with the head of your bed higher than your feet. Use a wedge under the mattress or blocks under the bed frame to raise the head of the bed. Chew sugar-free gum after meals. What foods should eat?  Eat a healthy, well-balanced diet of fruits, vegetables, whole grains, low-fat dairy products, lean meats, fish, and poultry. Each person is different. Foods that may cause symptoms in one person may not cause any symptoms in another person. Work with your doctor to find foods that are safe for you. The items listed above may not be a complete list of what you can eat and drink. Contact a food expert for more options. What foods should I avoid? Limiting some of these foods may help in managing the symptoms of GERD. Everyone is different. Talk with a food expert or your doctor to help you find the exact foods to avoid, if any. Fruits Any fruits prepared with added fat. Any fruits that cause symptoms. For some people, this may include citrus fruits, such as oranges, grapefruit, pineapple, and lemons. Vegetables Deep-fried vegetables. [place] fries. Any vegetables prepared with added fat. Any vegetables that cause symptoms. For some people, this may include tomatoes and tomato products, chili peppers, onions and garlic, and horseradish. Grains Pastries or quick breads with added fat. Meats and other proteins High-fat meats, such as fatty beef or pork, hot dogs, ribs, ham, sausage, salami, and bacon. Fried meat or protein, including fried  fish and fried chicken. Nuts and nut butters, in large amounts. Dairy Whole milk and chocolate milk. Sour cream. Cream. Ice cream. Cream cheese. Milkshakes. Fats and oils Butter. Margarine. Shortening. Ghee. Beverages Coffee and tea, with or without caffeine. Carbonated beverages. Sodas. Energy drinks. Fruit juice made with acidic fruits, such as orange or grapefruit. Tomato juice. Alcoholic drinks. Sweets and  desserts Chocolate and cocoa. Donuts. Seasonings and condiments Pepper. Peppermint and spearmint. Added salt. Any condiments, herbs, or seasonings that cause symptoms. For some people, this may include curry, hot sauce, or vinegar-based salad dressings. The items listed above may not be a complete list of what you should not eat and drink. Contact a food expert for more options. Questions to ask your doctor Diet and lifestyle changes are often the first steps that are taken to manage symptoms of GERD. If diet and lifestyle changes do not help, talk with your doctor about taking medicines. Where to find more information International Foundation for Gastrointestinal Disorders: aboutgerd.org Summary When you have GERD, food and lifestyle choices are very important in easing your symptoms. Eat small meals often instead of 3 large meals a day. Eat your meals slowly and in a place where you are relaxed. Avoid bending over or lying down until 2-3 hours after eating. Limit high-fat foods such as fatty meats or fried foods. This information is not intended to replace advice given to you by your health care provider. Make sure you discuss any questions you have with your health care provider. Document Revised: 03/26/2020 Document Reviewed: 03/26/2020 Elsevier Patient Education  [place].

## 2022-10-07 ENCOUNTER — Telehealth: Payer: Self-pay

## 2022-10-07 NOTE — Telephone Encounter (Signed)
Patient called stating that he received a text stating how much he is to pay out-of-pocket and he stated that he did not have that kind of money to pay right now. Therefore, he stated that he wanted to cancel his procedure. I then called Trish from endoscopy unit and cancelled his procedure.

## 2022-10-08 ENCOUNTER — Ambulatory Visit
Admission: RE | Admit: 2022-10-08 | Payer: BC Managed Care – PPO | Source: Home / Self Care | Admitting: Gastroenterology

## 2022-10-08 ENCOUNTER — Encounter: Admission: RE | Payer: Self-pay | Source: Home / Self Care

## 2022-10-08 SURGERY — ESOPHAGOGASTRODUODENOSCOPY (EGD) WITH PROPOFOL
Anesthesia: General

## 2022-10-27 ENCOUNTER — Other Ambulatory Visit: Payer: Self-pay | Admitting: Family Medicine

## 2022-10-27 DIAGNOSIS — E782 Mixed hyperlipidemia: Secondary | ICD-10-CM

## 2022-10-27 NOTE — Telephone Encounter (Signed)
Medication Refill - Medication: rosuvastatin (CRESTOR) 10 MG tablet   Has the patient contacted their pharmacy? No. (Agent: If no, request that the patient contact the pharmacy for the refill. If patient does not wish to contact the pharmacy document the reason why and proceed with request.) (Agent: If yes, when and what did the pharmacy advise?)  Preferred Pharmacy (with phone number or street name):  [id number] Phone: [phone] Fax: [phone]    Has the patient been seen for an appointment in the last year OR does the patient have an upcoming appointment? Yes.    Agent: Please be advised that RX refills may take up to 3 business days. We ask that you follow-up with your pharmacy.

## 2022-10-28 MED ORDER — ROSUVASTATIN CALCIUM 10 MG PO TABS: 10.0000 mg | ORAL_TABLET | Freq: Every day | ORAL | 3 refills | Status: AC

## 2022-10-28 NOTE — Telephone Encounter (Signed)
Requested medication (s) are due for refill today: yes  Requested medication (s) are on the active medication list: yes  Last refill:  01/29/22  Future visit scheduled: yes  Notes to clinic:  Unable to refill per protocol, last refill by another provider.      Requested Prescriptions  Pending Prescriptions Disp Refills   rosuvastatin (CRESTOR) 10 MG tablet 90 tablet 1    Sig: Take 1 tablet (10 mg total) by mouth daily.     Cardiovascular:  Antilipid - Statins 2 Failed - 10/27/2022  1:14 PM      Failed - Lipid Panel in normal range within the last 12 months    Cholesterol, Total  Date Value Ref Range Status  05/20/2021 157 100 - 199 mg/dL Final   Cholesterol  Date Value Ref Range Status  06/17/2022 154 <200 mg/dL Final   LDL Cholesterol (Calc)  Date Value Ref Range Status  06/17/2022 79 mg/dL (calc) Final    Comment:    Reference range: <100 . Desirable range <100 mg/dL for primary prevention;   <70 mg/dL for patients with CHD or diabetic patients  with > or = 2 CHD risk factors. Marland Kitchen LDL-C is now calculated using the Martin-Hopkins  calculation, which is a validated novel method providing  better accuracy than the Friedewald equation in the  estimation of LDL-C.  Cresenciano Genre et al. Annamaria Helling. 1950;932(67): 2061-2068  (http://education.[employer].com/faq/FAQ164)    HDL  Date Value Ref Range Status  06/17/2022 59 > OR = 40 mg/dL Final  05/20/2021 64 >39 mg/dL Final   Triglycerides  Date Value Ref Range Status  06/17/2022 84 <150 mg/dL Final         Passed - Cr in normal range and within 360 days    Creat  Date Value Ref Range Status  06/17/2022 1.22 0.70 - 1.35 mg/dL Final         Passed - Patient is not pregnant      Passed - Valid encounter within last 12 months    Recent Outpatient Visits           4 months ago Annual physical exam   [place], DO   5 months ago Gastroesophageal reflux disease  without esophagitis   [place], Devonne Doughty, DO       Future Appointments             In 2 months [name], [facility] [place]   In 8 months [place], Ronda Fairly, [place]

## 2022-12-03 ENCOUNTER — Other Ambulatory Visit: Payer: Self-pay | Admitting: Urology

## 2022-12-18 ENCOUNTER — Telehealth: Payer: Self-pay | Admitting: Family Medicine

## 2022-12-18 NOTE — Telephone Encounter (Signed)
LMOM informed patient that if he has an insurance card for prescription drugs I will need a copy. I told him that if he could take a picture of it and send it on Mychart I will try to get his Testosterone approved.

## 2022-12-22 NOTE — Telephone Encounter (Signed)
Spoke to patient and explained the situation. He states he uses [facility]. He can get his medication.

## 2022-12-22 NOTE — Telephone Encounter (Signed)
Patient returned call. I relayed message to patient and he is requesting a call back to discuss issue. He stated that his insurance card is already on file.

## 2022-12-30 ENCOUNTER — Other Ambulatory Visit: Payer: Self-pay | Admitting: *Deleted

## 2022-12-30 MED ORDER — TADALAFIL 20 MG PO TABS: 20.0000 mg | ORAL_TABLET | Freq: Every day | ORAL | 0 refills | Status: DC | PRN

## 2022-12-30 NOTE — Telephone Encounter (Signed)
Patient calling asking for 20mth supply of cialis, ok to fill?

## 2023-01-12 DIAGNOSIS — M25551 Pain in right hip: Secondary | ICD-10-CM | POA: Insufficient documentation

## 2023-01-12 DIAGNOSIS — I10 Essential (primary) hypertension: Secondary | ICD-10-CM | POA: Insufficient documentation

## 2023-01-12 DIAGNOSIS — R7303 Prediabetes: Secondary | ICD-10-CM | POA: Insufficient documentation

## 2023-01-13 ENCOUNTER — Ambulatory Visit: Payer: BC Managed Care – PPO | Admitting: Gastroenterology

## 2023-01-21 ENCOUNTER — Other Ambulatory Visit: Payer: BC Managed Care – PPO

## 2023-01-21 DIAGNOSIS — E349 Endocrine disorder, unspecified: Secondary | ICD-10-CM | POA: Diagnosis not present

## 2023-01-22 ENCOUNTER — Encounter: Payer: Self-pay | Admitting: *Deleted

## 2023-01-22 ENCOUNTER — Other Ambulatory Visit: Payer: BC Managed Care – PPO

## 2023-01-22 LAB — HEMATOCRIT: Hematocrit: 51.8 % — ABNORMAL HIGH (ref 37.5–51.0)

## 2023-01-22 LAB — TESTOSTERONE: Testosterone: 425 ng/dL (ref 264–916)

## 2023-02-08 ENCOUNTER — Ambulatory Visit
Admission: EM | Admit: 2023-02-08 | Discharge: 2023-02-08 | Disposition: A | Discharge status: Home / Self Care | Payer: BC Managed Care – PPO | Attending: Physician Assistant | Admitting: Physician Assistant

## 2023-02-08 DIAGNOSIS — J019 Acute sinusitis, unspecified: Secondary | ICD-10-CM | POA: Diagnosis not present

## 2023-02-08 DIAGNOSIS — R519 Headache, unspecified: Secondary | ICD-10-CM | POA: Diagnosis not present

## 2023-02-08 DIAGNOSIS — R0981 Nasal congestion: Secondary | ICD-10-CM | POA: Diagnosis not present

## 2023-02-08 MED ORDER — AMOXICILLIN-POT CLAVULANATE 875-125 MG PO TABS: 1.0000 | ORAL_TABLET | Freq: Two times a day (BID) | ORAL | 0 refills | Status: AC

## 2023-02-08 MED ORDER — PROMETHAZINE-DM 6.25-15 MG/5ML PO SYRP: 5.0000 mL | ORAL_SOLUTION | Freq: Four times a day (QID) | ORAL | 0 refills | Status: AC | PRN

## 2023-02-08 NOTE — ED Provider Notes (Signed)
[facility]    CSN: [id number] Arrival date & time: 02/08/23  1521      History   Chief Complaint Chief Complaint  Patient presents with   Headache   Nasal Congestion    HPI Braelon Brockmeier is a 62 y.o. male presenting for 8 day history of nasal congestion, sinus pressure, cough, and headaches. Also reports fatigue and low grade temps of 100 degrees.  Denies high grade fever, ear pain, sore throat, chest pain, shortness of breath, abdominal pain, n/v/d. Has tried OTC meds and nasal sprays without relief. No sick contacts. Concerned about possible sinus infection. PMH significant for hypertension, sleep apnea, testosterone deficiency,  hyperlipidemia, and pre-diabetes.   HPI  Past Medical History:  Diagnosis Date   Hypertension     Patient Active Problem List   Diagnosis Date Noted   Prediabetes 01/12/2023   Right hip pain 01/12/2023   Essential (primary) hypertension 01/12/2023   Essential hypertension 05/12/2022   Erectile dysfunction 06/16/2021   Hyperlipidemia 06/16/2021   Gastroesophageal reflux disease without esophagitis 11/05/2018   Healthcare maintenance 11/05/2018   Hypercholesterolemia 11/05/2018   Snoring 11/05/2018   Testosterone deficiency 11/05/2018    Past Surgical History:  Procedure Laterality Date   COLONOSCOPY     COLONOSCOPY WITH PROPOFOL N/A 04/10/2020   Procedure: COLONOSCOPY WITH PROPOFOL;  Surgeon: Wyline Mood, MD;  Location: [facility] ENDOSCOPY;  Service: Gastroenterology;  Laterality: N/A;       Home Medications    Prior to Admission medications   Medication Sig Start Date End Date Taking? Authorizing Provider  amLODipine (NORVASC) 5 MG tablet Take 1 tablet (5 mg total) by mouth daily. 06/24/22  Yes Karamalegos, Netta Neat, DO  amoxicillin-clavulanate (AUGMENTIN) 875-125 MG tablet Take 1 tablet by mouth every 12 (twelve) hours for 7 days. 02/08/23 02/15/23 Yes Eusebio Friendly B, PA-C  Glucosamine HCl (GLUCOSAMINE PO) Take by mouth.    Yes [provider]  NEEDLE, DISP, 18 G (B-D BLUNT FILL NEEDLE) 18G X 1-1/2" MISC 1 each by Does not apply route every 14 (fourteen) days. 12/18/20  Yes Stoioff, Verna Czech, MD  pantoprazole (PROTONIX) 40 MG tablet Take 1 tablet (40 mg total) by mouth 2 (two) times daily. 10/01/22  Yes Wyline Mood, MD  promethazine-dextromethorphan (PROMETHAZINE-DM) 6.25-15 MG/5ML syrup Take 5 mLs by mouth 4 (four) times daily as needed. 02/08/23  Yes Eusebio Friendly B, PA-C  rosuvastatin (CRESTOR) 10 MG tablet Take 1 tablet (10 mg total) by mouth at bedtime. 10/28/22  Yes Karamalegos, Netta Neat, DO  tadalafil (CIALIS) 20 MG tablet Take 1 tablet (20 mg total) by mouth daily as needed for erectile dysfunction. 12/30/22  Yes Stoioff, Verna Czech, MD  testosterone cypionate (DEPOTESTOSTERONE CYPIONATE) 200 MG/ML injection INJECT 1 ML (200 MG TOTAL) INTO THE MUSCLE EVERY 14 DAYS 12/03/22  Yes McGowan, Carollee Herter A, PA-C  tiZANidine (ZANAFLEX) 2 MG tablet Take 2 mg by mouth at bedtime. 05/10/22  Yes [provider]    Family History Family History  Problem Relation Age of Onset   Stroke Mother    Aneurysm Brother     Social History Social History   Tobacco Use   Smoking status: Former    Packs/day: 0.50    Years: 10.00    Additional pack years: 0.00    Total pack years: 5.00    Types: Cigarettes    Start date: 47    Quit date: 1990    Years since quitting: 34.3   Smokeless tobacco: Never  Tobacco comments:    Intermittent smoking only, not consistency  Vaping Use   Vaping Use: Never used  Substance Use Topics   Alcohol use: Yes    Alcohol/week: 1.0 - 2.0 standard drink of alcohol    Types: 1 - 2 Standard drinks or equivalent per week    Comment: moderate   Drug use: Never     Allergies   Patient has no known allergies.   Review of Systems Review of Systems  Constitutional:  Positive for fatigue. Negative for fever.  HENT:  Positive for congestion, nosebleeds, rhinorrhea and sinus  pressure. Negative for sinus pain and sore throat.   Respiratory:  Positive for cough. Negative for shortness of breath.   Cardiovascular:  Negative for chest pain.  Gastrointestinal:  Negative for abdominal pain, diarrhea, nausea and vomiting.  Musculoskeletal:  Negative for myalgias.  Neurological:  Positive for headaches. Negative for weakness and light-headedness.  Hematological:  Negative for adenopathy.     Physical Exam Triage Vital Signs ED Triage Vitals  Enc Vitals Group     BP      Pulse      Resp      Temp      Temp src      SpO2      Weight      Height      Head Circumference      Peak Flow      Pain Score      Pain Loc      Pain Edu?      Excl. in GC?    No data found.  Updated Vital Signs BP 135/87 (BP Location: Left Arm)   Pulse 91   Temp 98.5 F (36.9 C) (Oral)   Ht 5\' 9"  (1.753 m)   Wt 215 lb (97.5 kg)   SpO2 95%   BMI 31.75 kg/m      Physical Exam Vitals and nursing note reviewed.  Constitutional:      General: He is not in acute distress.    Appearance: Normal appearance. He is well-developed. He is not ill-appearing.  HENT:     Head: Normocephalic and atraumatic.     Right Ear: Tympanic membrane, ear canal and external ear normal.     Left Ear: Tympanic membrane, ear canal and external ear normal.     Nose: Congestion present.     Mouth/Throat:     Mouth: Mucous membranes are moist.     Pharynx: Oropharynx is clear.  Eyes:     General: No scleral icterus.    Conjunctiva/sclera:     Right eye: Right conjunctiva is injected.     Left eye: Left conjunctiva is injected.  Cardiovascular:     Rate and Rhythm: Normal rate and regular rhythm.     Heart sounds: Normal heart sounds.  Pulmonary:     Effort: Pulmonary effort is normal. No respiratory distress.     Breath sounds: Normal breath sounds.  Musculoskeletal:     Cervical back: Neck supple.  Skin:    General: Skin is warm and dry.     Capillary Refill: Capillary refill takes less  than 2 seconds.  Neurological:     General: No focal deficit present.     Mental Status: He is alert. Mental status is at baseline.     Motor: No weakness.     Gait: Gait normal.  Psychiatric:        Mood and Affect: Mood normal.  Behavior: Behavior normal.      UC Treatments / Results  Labs (all labs ordered are listed, but only abnormal results are displayed) Labs Reviewed - No data to display  EKG   Radiology No results found.  Procedures Procedures (including critical care time)  Medications Ordered in UC Medications - No data to display  Initial Impression / Assessment and Plan / UC Course  I have reviewed the triage vital signs and the nursing notes.  Pertinent labs & imaging results that were available during my care of the patient were reviewed by me and considered in my medical decision making (see chart for details).   62 y/o male presents for 8 day history of fatigue, congestion, sinus pressure. Low grade temps and worsening symptoms.  Patient is afebrile and overall well appearing. On exam, he has nasal congestion. Chest is clear.   Suspect possible acute sinusitis. Will treat with Augmentin. Supportive care advised. Advised use of nasal sprays, ibuprofen/Tylenol, decongestants/ antihistamines. Reviewed return and ED precautions.   Final Clinical Impressions(s) / UC Diagnoses   Final diagnoses:  Acute sinusitis, recurrence not specified, unspecified location  Nasal congestion  Acute nonintractable headache, unspecified headache type     Discharge Instructions      You have a sinus infection which may be due to a virus or bacteria. I have sent antibiotics. Use medications as directed, including cough syrup, nasal saline, and decongestants. Consider OTC redness relief eye drops. Your symptoms should improve over the next few days and resolve within 7-10 days. Increase rest and fluids. F/u if symptoms worsen or predominate such as sore throat, ear  pain, productive cough, shortness of breath, or if you develop high fevers or worsening fatigue over the next several days.       ED Prescriptions     Medication Sig Dispense Auth. Provider   amoxicillin-clavulanate (AUGMENTIN) 875-125 MG tablet Take 1 tablet by mouth every 12 (twelve) hours for 7 days. 14 tablet Shirlee Latch, PA-C   promethazine-dextromethorphan (PROMETHAZINE-DM) 6.25-15 MG/5ML syrup Take 5 mLs by mouth 4 (four) times daily as needed. 118 mL Shirlee Latch, PA-C      PDMP not reviewed this encounter.   Shirlee Latch, PA-C 02/08/23 1551

## 2023-02-08 NOTE — ED Triage Notes (Signed)
Pt c/o possible sinus infection.  Pt states that he is having congestion, sinus headache, temperature of 100.1 x8days  Pt c/o ongoing nosebleeds. Pt states that he has been using Menthol-Vicks nose spray and is now having nosebleeds. Pt states his last nosebleed was last night for a couple of minutes.   Pt last used the Menthol-Vicks last night.

## 2023-02-08 NOTE — Discharge Instructions (Addendum)
You have a sinus infection which may be due to a virus or bacteria. I have sent antibiotics. Use medications as directed, including cough syrup, nasal saline, and decongestants. Consider OTC redness relief eye drops. Your symptoms should improve over the next few days and resolve within 7-10 days. Increase rest and fluids. F/u if symptoms worsen or predominate such as sore throat, ear pain, productive cough, shortness of breath, or if you develop high fevers or worsening fatigue over the next several days.

## 2023-03-26 ENCOUNTER — Other Ambulatory Visit: Payer: Self-pay | Admitting: Gastroenterology

## 2023-03-26 DIAGNOSIS — K219 Gastro-esophageal reflux disease without esophagitis: Secondary | ICD-10-CM

## 2023-04-21 ENCOUNTER — Other Ambulatory Visit: Payer: BC Managed Care – PPO

## 2023-04-21 ENCOUNTER — Telehealth: Payer: Self-pay | Admitting: Urology

## 2023-04-21 ENCOUNTER — Other Ambulatory Visit: Payer: Self-pay

## 2023-04-21 DIAGNOSIS — E291 Testicular hypofunction: Secondary | ICD-10-CM | POA: Diagnosis not present

## 2023-04-21 DIAGNOSIS — E349 Endocrine disorder, unspecified: Secondary | ICD-10-CM

## 2023-04-21 DIAGNOSIS — N529 Male erectile dysfunction, unspecified: Secondary | ICD-10-CM

## 2023-04-21 NOTE — Telephone Encounter (Signed)
Looks like patient has labs done to day. We will wait in results

## 2023-04-21 NOTE — Telephone Encounter (Signed)
Patient requested refill for Testosterone. Pharmacy is [facility] on [address].

## 2023-04-22 ENCOUNTER — Other Ambulatory Visit: Payer: Self-pay | Admitting: Urology

## 2023-04-22 ENCOUNTER — Encounter: Payer: Self-pay | Admitting: Urology

## 2023-04-22 DIAGNOSIS — E291 Testicular hypofunction: Secondary | ICD-10-CM

## 2023-04-22 LAB — LUTEINIZING HORMONE: LH: 2.2 m[IU]/mL (ref 1.7–8.6)

## 2023-04-22 LAB — TESTOSTERONE: Testosterone: 124 ng/dL — ABNORMAL LOW (ref 264–916)

## 2023-04-22 LAB — HEMOGLOBIN AND HEMATOCRIT, BLOOD
Hematocrit: 47.2 % (ref 37.5–51.0)
Hemoglobin: 15.8 g/dL (ref 13.0–17.7)

## 2023-04-22 MED ORDER — TESTOSTERONE CYPIONATE 200 MG/ML IM SOLN
200.0000 mg | INTRAMUSCULAR | 0 refills | Status: AC
Start: 2023-04-22 — End: ?

## 2023-04-22 MED ORDER — TESTOSTERONE CYPIONATE 200 MG/ML IM SOLN: 200.0000 mg | INTRAMUSCULAR | 0 refills | Status: DC

## 2023-04-23 ENCOUNTER — Other Ambulatory Visit: Payer: BC Managed Care – PPO

## 2023-04-24 ENCOUNTER — Ambulatory Visit
Admission: EM | Admit: 2023-04-24 | Discharge: 2023-04-24 | Disposition: A | Discharge status: Home / Self Care | Payer: BC Managed Care – PPO | Attending: Family Medicine | Admitting: Family Medicine

## 2023-04-24 ENCOUNTER — Encounter: Payer: Self-pay | Admitting: Emergency Medicine

## 2023-04-24 DIAGNOSIS — I10 Essential (primary) hypertension: Secondary | ICD-10-CM | POA: Diagnosis not present

## 2023-04-24 LAB — CBC WITH DIFFERENTIAL/PLATELET
Abs Immature Granulocytes: 0.01 10*3/uL (ref 0.00–0.07)
Basophils Absolute: 0.1 10*3/uL (ref 0.0–0.1)
Basophils Relative: 1 %
Eosinophils Absolute: 0.2 10*3/uL (ref 0.0–0.5)
Eosinophils Relative: 3 %
HCT: 48.1 % (ref 39.0–52.0)
Hemoglobin: 15.7 g/dL (ref 13.0–17.0)
Immature Granulocytes: 0 %
Lymphocytes Relative: 39 %
Lymphs Abs: 2.2 10*3/uL (ref 0.7–4.0)
MCH: 27.3 pg (ref 26.0–34.0)
MCHC: 32.6 g/dL (ref 30.0–36.0)
MCV: 83.7 fL (ref 80.0–100.0)
Monocytes Absolute: 0.5 10*3/uL (ref 0.1–1.0)
Monocytes Relative: 9 %
Neutro Abs: 2.7 10*3/uL (ref 1.7–7.7)
Neutrophils Relative %: 48 %
Platelets: 196 10*3/uL (ref 150–400)
RBC: 5.75 MIL/uL (ref 4.22–5.81)
RDW: 13.3 % (ref 11.5–15.5)
WBC: 5.7 10*3/uL (ref 4.0–10.5)
nRBC: 0 % (ref 0.0–0.2)

## 2023-04-24 LAB — COMPREHENSIVE METABOLIC PANEL
ALT: 17 U/L (ref 0–44)
AST: 23 U/L (ref 15–41)
Albumin: 4.6 g/dL (ref 3.5–5.0)
Alkaline Phosphatase: 56 U/L (ref 38–126)
Anion gap: 8 (ref 5–15)
BUN: 16 mg/dL (ref 8–23)
CO2: 26 mmol/L (ref 22–32)
Calcium: 8.9 mg/dL (ref 8.9–10.3)
Chloride: 98 mmol/L (ref 98–111)
Creatinine, Ser: 1.14 mg/dL (ref 0.61–1.24)
GFR, Estimated: >60 mL/min (ref >60)
Glucose, Bld: 83 mg/dL (ref 70–99)
Potassium: 4.1 mmol/L (ref 3.5–5.1)
Sodium: 132 mmol/L — ABNORMAL LOW (ref 135–145)
Total Bilirubin: 0.7 mg/dL (ref 0.3–1.2)
Total Protein: 8.2 g/dL — ABNORMAL HIGH (ref 6.5–8.1)

## 2023-04-24 NOTE — Discharge Instructions (Addendum)
Your sodium level is a little low and your protein level is a little elevated.  Otherwise your blood work was normal.   Please monitor your blood pressure at home first thing in the morning prior to eating and drinking and journal this for PCP follow-up. If you remain persistently elevated after your symptoms improve, you may need medical management. Other recommendations for high blood pressure are to decrease the amount of salt in your diet, exercise (150 minutes of moderate intensity exercise weekly), weight loss.  You should follow up with your primary doctor in 2-3 days regarding today's urgent care visit. If your symptoms persist, you may need a additional cardiovascular evaluation.

## 2023-04-24 NOTE — ED Triage Notes (Signed)
Pt states he was at work yesterday and noticed his eyes were red, so he checked his BP and it was 170/90. He states he has a mild headache. Denies chest pain or shortness of breath. Pt has h/o htn.

## 2023-04-24 NOTE — ED Provider Notes (Signed)
[facility]    CSN: [id number] Arrival date & time: 04/24/23  1554      History   Chief Complaint Chief Complaint  Patient presents with   Hypertension    HPI Fred Reeves is a 62 y.o. male.   HPI   Fred Reeves presents for elevated blood pressure. He was at work yesterday and his eyes have been blood shot.  He got his blood pressure was elevated 170/90.  States his primary care provider put on BP medication.  He went home and took another BP pill and relaxed.  No new medication and new medication.  He has been taking testosterone but he ran out and just restarted it yesterday but only missed only 3-4 days. Has slight headache.      Past Medical History:  Diagnosis Date   Hypertension     Patient Active Problem List   Diagnosis Date Noted   Prediabetes 01/12/2023   Right hip pain 01/12/2023   Essential (primary) hypertension 01/12/2023   Essential hypertension 05/12/2022   Erectile dysfunction 06/16/2021   Hyperlipidemia 06/16/2021   Gastroesophageal reflux disease without esophagitis 11/05/2018   Healthcare maintenance 11/05/2018   Hypercholesterolemia 11/05/2018   Snoring 11/05/2018   Testosterone deficiency 11/05/2018    Past Surgical History:  Procedure Laterality Date   COLONOSCOPY     COLONOSCOPY WITH PROPOFOL N/A 04/10/2020   Procedure: COLONOSCOPY WITH PROPOFOL;  Surgeon: Wyline Mood, MD;  Location: [facility] ENDOSCOPY;  Service: Gastroenterology;  Laterality: N/A;       Home Medications    Prior to Admission medications   Medication Sig Start Date End Date Taking? Authorizing Provider  amLODipine (NORVASC) 5 MG tablet Take 1 tablet (5 mg total) by mouth daily. 06/24/22  Yes Karamalegos, Netta Neat, DO  pantoprazole (PROTONIX) 40 MG tablet Take 1 tablet (40 mg total) by mouth 2 (two) times daily. 10/01/22  Yes Wyline Mood, MD  tadalafil (CIALIS) 20 MG tablet Take 1 tablet (20 mg total) by mouth daily as needed for erectile dysfunction. 12/30/22  Yes  Stoioff, Verna Czech, MD  testosterone cypionate (DEPOTESTOSTERONE CYPIONATE) 200 MG/ML injection Inject 1 mL (200 mg total) into the muscle every 14 (fourteen) days. 04/22/23  Yes McGowan, Carollee Herter A, PA-C  Glucosamine HCl (GLUCOSAMINE PO) Take by mouth.    [provider]  NEEDLE, DISP, 18 G (B-D BLUNT FILL NEEDLE) 18G X 1-1/2" MISC 1 each by Does not apply route every 14 (fourteen) days. 12/18/20   Stoioff, Verna Czech, MD  promethazine-dextromethorphan (PROMETHAZINE-DM) 6.25-15 MG/5ML syrup Take 5 mLs by mouth 4 (four) times daily as needed. 02/08/23   Eusebio Friendly B, PA-C  rosuvastatin (CRESTOR) 10 MG tablet Take 1 tablet (10 mg total) by mouth at bedtime. 10/28/22   Karamalegos, Netta Neat, DO  tiZANidine (ZANAFLEX) 2 MG tablet Take 2 mg by mouth at bedtime. 05/10/22   [provider]    Family History Family History  Problem Relation Age of Onset   Stroke Mother    Aneurysm Brother     Social History Social History   Tobacco Use   Smoking status: Former    Current packs/day: 0.00    Average packs/day: 0.5 packs/day for 16.0 years (8.0 ttl pk-yrs)    Types: Cigarettes    Start date: 52    Quit date: 1990    Years since quitting: 34.6   Smokeless tobacco: Never   Tobacco comments:    Intermittent smoking only, not consistency  Vaping Use   Vaping status:  Never Used  Substance Use Topics   Alcohol use: Yes    Alcohol/week: 1.0 - 2.0 standard drink of alcohol    Types: 1 - 2 Standard drinks or equivalent per week    Comment: moderate   Drug use: Never     Allergies   Patient has no known allergies.   Review of Systems Review of Systems: negative unless otherwise stated in HPI.      Physical Exam Triage Vital Signs ED Triage Vitals  Encounter Vitals Group     BP 04/24/23 1609 (!) 150/91     Systolic BP Percentile --      Diastolic BP Percentile --      Pulse Rate 04/24/23 1609 68     Resp 04/24/23 1609 16     Temp 04/24/23 1609 98 F (36.7 C)      Temp Source 04/24/23 1609 Oral     SpO2 04/24/23 1609 100 %     Weight 04/24/23 1607 214 lb 15.2 oz (97.5 kg)     Height 04/24/23 1607 5\' 9"  (1.753 m)     Head Circumference --      Peak Flow --      Pain Score 04/24/23 1606 5     Pain Loc --      Pain Education --      Exclude from Growth Chart --    No data found.  Updated Vital Signs BP (!) 150/91 (BP Location: Left Arm)   Pulse 68   Temp 98 F (36.7 C) (Oral)   Resp 16   Ht 5\' 9"  (1.753 m)   Wt 97.5 kg   SpO2 100%   BMI 31.74 kg/m   Visual Acuity Right Eye Distance:   Left Eye Distance:   Bilateral Distance:    Right Eye Near:   Left Eye Near:    Bilateral Near:     Physical Exam GEN: pleasant well appearing older male, in no acute distress  CV: regular rate and rhythm, no murmurs appreciated  RESP: no increased work of breathing, clear to ascultation bilaterally MSK:  trace pitting edema SKIN: warm, dry, no rash on visible skin       UC Treatments / Results  Labs (all labs ordered are listed, but only abnormal results are displayed) Labs Reviewed  COMPREHENSIVE METABOLIC PANEL - Abnormal; Notable for the following components:      Result Value   Sodium 132 (*)    Total Protein 8.2 (*)    All other components within normal limits  CBC WITH DIFFERENTIAL/PLATELET    EKG  If EKG performed, see my interpretation in the MDM section  Radiology No results found.   Procedures Procedures (including critical care time)  Medications Ordered in UC Medications - No data to display  Initial Impression / Assessment and Plan / UC Course  I have reviewed the triage vital signs and the nursing notes.  Pertinent labs & imaging results that were available during my care of the patient were reviewed by me and considered in my medical decision making (see chart for details).       Patient is a 62 y.o. male with hx of GERD, HTN who presents for elevated blood pressure.  Overall patient is well-appearing  and afebrile.   Fred Reeves is hypertensive here.  BP 150/91. Pt requesting blood work. CBC and CMP obtained and showed slight hyponatremia, Na 132. I suspect high blood pressure issue is secondary to testosterone use vs under-treatment. Takes amlodipine 5  mg.  Denies needing refills. Recommended he check hisblood pressure and follow up with his primary care provider in the next 2 weeks.    ED and return precautions given and patient/guardian voiced understanding. Discussed MDM, treatment plan and plan for follow-up with patient who agrees with plan.    Final Clinical Impressions(s) / UC Diagnoses   Final diagnoses:  Elevated blood pressure reading with diagnosis of hypertension     Discharge Instructions      Your sodium level is a little low and your protein level is a little elevated.  Otherwise your blood work was normal.   Please monitor your blood pressure at home first thing in the morning prior to eating and drinking and journal this for PCP follow-up. If you remain persistently elevated after your symptoms improve, you may need medical management. Other recommendations for high blood pressure are to decrease the amount of salt in your diet, exercise (150 minutes of moderate intensity exercise weekly), weight loss.  You should follow up with your primary doctor in 2-3 days regarding today's urgent care visit. If your symptoms persist, you may need a additional cardiovascular evaluation.      ED Prescriptions   None    PDMP not reviewed this encounter.   Katha Cabal, DO 05/02/23 2351

## 2023-04-28 ENCOUNTER — Encounter: Payer: Self-pay | Admitting: Emergency Medicine

## 2023-04-28 ENCOUNTER — Emergency Department: Payer: BC Managed Care – PPO

## 2023-04-28 ENCOUNTER — Emergency Department
Admission: EM | Admit: 2023-04-28 | Discharge: 2023-04-28 | Disposition: A | Discharge status: Home / Self Care | Payer: BC Managed Care – PPO | Attending: Emergency Medicine | Admitting: Emergency Medicine

## 2023-04-28 ENCOUNTER — Other Ambulatory Visit: Payer: Self-pay

## 2023-04-28 DIAGNOSIS — I700 Atherosclerosis of aorta: Secondary | ICD-10-CM | POA: Diagnosis not present

## 2023-04-28 DIAGNOSIS — R1906 Epigastric swelling, mass or lump: Secondary | ICD-10-CM | POA: Diagnosis not present

## 2023-04-28 DIAGNOSIS — R202 Paresthesia of skin: Secondary | ICD-10-CM | POA: Insufficient documentation

## 2023-04-28 DIAGNOSIS — R0789 Other chest pain: Secondary | ICD-10-CM | POA: Diagnosis not present

## 2023-04-28 DIAGNOSIS — R079 Chest pain, unspecified: Secondary | ICD-10-CM | POA: Diagnosis not present

## 2023-04-28 DIAGNOSIS — I10 Essential (primary) hypertension: Secondary | ICD-10-CM | POA: Diagnosis not present

## 2023-04-28 DIAGNOSIS — R918 Other nonspecific abnormal finding of lung field: Secondary | ICD-10-CM | POA: Diagnosis not present

## 2023-04-28 DIAGNOSIS — E041 Nontoxic single thyroid nodule: Secondary | ICD-10-CM | POA: Diagnosis not present

## 2023-04-28 DIAGNOSIS — D71 Functional disorders of polymorphonuclear neutrophils: Secondary | ICD-10-CM | POA: Diagnosis not present

## 2023-04-28 LAB — BASIC METABOLIC PANEL
Anion gap: 9 (ref 5–15)
BUN: 17 mg/dL (ref 8–23)
CO2: 25 mmol/L (ref 22–32)
Calcium: 9.1 mg/dL (ref 8.9–10.3)
Chloride: 103 mmol/L (ref 98–111)
Creatinine, Ser: 1.13 mg/dL (ref 0.61–1.24)
GFR, Estimated: >60 mL/min (ref >60)
Glucose, Bld: 108 mg/dL — ABNORMAL HIGH (ref 70–99)
Potassium: 3.4 mmol/L — ABNORMAL LOW (ref 3.5–5.1)
Sodium: 137 mmol/L (ref 135–145)

## 2023-04-28 LAB — CBC
HCT: 48.6 % (ref 39.0–52.0)
Hemoglobin: 15.7 g/dL (ref 13.0–17.0)
MCH: 27.1 pg (ref 26.0–34.0)
MCHC: 32.3 g/dL (ref 30.0–36.0)
MCV: 83.9 fL (ref 80.0–100.0)
Platelets: 186 10*3/uL (ref 150–400)
RBC: 5.79 MIL/uL (ref 4.22–5.81)
RDW: 13 % (ref 11.5–15.5)
WBC: 6.3 10*3/uL (ref 4.0–10.5)
nRBC: 0 % (ref 0.0–0.2)

## 2023-04-28 LAB — HEPATIC FUNCTION PANEL
ALT: 20 U/L (ref 0–44)
AST: 39 U/L (ref 15–41)
Albumin: 3.9 g/dL (ref 3.5–5.0)
Alkaline Phosphatase: 51 U/L (ref 38–126)
Bilirubin, Direct: <0.1 mg/dL (ref 0.0–0.2)
Total Bilirubin: 0.6 mg/dL (ref 0.3–1.2)
Total Protein: 7.5 g/dL (ref 6.5–8.1)

## 2023-04-28 LAB — TROPONIN I (HIGH SENSITIVITY)
Troponin I (High Sensitivity): 8 ng/L (ref <18)
Troponin I (High Sensitivity): 7 ng/L (ref <18)

## 2023-04-28 LAB — LIPASE, BLOOD: Lipase: 35 U/L (ref 11–51)

## 2023-04-28 MED ORDER — IOHEXOL 350 MG/ML SOLN
100.0000 mL | Freq: Once | INTRAVENOUS | Status: AC | PRN
  Administered 2023-04-28: 100 mL via INTRAVENOUS

## 2023-04-28 NOTE — ED Triage Notes (Signed)
Patient ambulatory to triage with steady gait, without difficulty or distress noted; pt reports awakening with mid CP, nonradiating accomp by tingling in hands; denies hx of same

## 2023-04-28 NOTE — ED Provider Notes (Signed)
[facility] Provider Note    Event Date/Time   First MD Initiated Contact with Patient 04/28/23 [phone]     (approximate)   History   Chief Complaint: Chest Pain   HPI  Fred Reeves is a 62 y.o. male with a history of GERD, hypertension who comes the ED complaining of awakening this morning with a sharp pain in the center of the chest radiating to his back.  Associated with tingling in the hands, left greater than right.  No shortness of breath diaphoresis or vomiting.  No constipation or diarrhea.  No aggravating or alleviating factors.  Constant.  Patient notes he was in his usual state of health until he had a testosterone injection 5 days ago.  After that, he has had intermittent episodes of flushing and sweating and elevated blood pressure.  Patient also complains of a mass in his epigastrium which he had not noticed before     Physical Exam   Triage Vital Signs: ED Triage Vitals  Encounter Vitals Group     BP 04/28/23 0330 (!) 152/75     Systolic BP Percentile --      Diastolic BP Percentile --      Pulse Rate 04/28/23 0330 92     Resp 04/28/23 0330 18     Temp 04/28/23 0330 98 F (36.7 C)     Temp Source 04/28/23 0330 Oral     SpO2 04/28/23 0330 100 %     Weight 04/28/23 0329 215 lb (97.5 kg)     Height 04/28/23 0329 5\' 9"  (1.753 m)     Head Circumference --      Peak Flow --      Pain Score 04/28/23 0329 5     Pain Loc --      Pain Education --      Exclude from Growth Chart --     Most recent vital signs: Vitals:   04/28/23 0540 04/28/23 0730  BP: (!) 145/86 119/79  Pulse: 73 66  Resp: 19 17  Temp: 98.5 F (36.9 C)   SpO2: 96% 100%    General: Awake, no distress.  CV:  Good peripheral perfusion.  Regular rate and rhythm.  Normal distal pulses Resp:  Normal effort.  Clear to auscultation bilaterally Abd:  No distention.  Soft nontender, palpable mass in the epigastrium in location of the xiphoid process Other:  No lower  extremity edema, no calf tenderness.   ED Results / Procedures / Treatments   Labs (all labs ordered are listed, but only abnormal results are displayed) Labs Reviewed  BASIC METABOLIC PANEL - Abnormal; Notable for the following components:      Result Value   Potassium 3.4 (*)    Glucose, Bld 108 (*)    All other components within normal limits  CBC  HEPATIC FUNCTION PANEL  LIPASE, BLOOD  TROPONIN I (HIGH SENSITIVITY)  TROPONIN I (HIGH SENSITIVITY)     EKG Interpreted by me Sinus rhythm rate of 89.  Normal axis intervals QRS ST segments and T waves   RADIOLOGY Chest x-ray interpreted by me, appears normal.  Radiology report reviewed   PROCEDURES:  Procedures   MEDICATIONS ORDERED IN ED: Medications  iohexol (OMNIPAQUE) 350 MG/ML injection 100 mL (100 mLs Intravenous Contrast Given 04/28/23 0801)     IMPRESSION / MDM / ASSESSMENT AND PLAN / ED COURSE  I reviewed the triage vital signs and the nursing notes.  DDx: Pancreatic mass, GERD, pancreatitis, aortic dissection,  non-STEMI  Patient's presentation is most consistent with acute presentation with potential threat to life or bodily function.  Patient presents with radiating chest pain as well as a palpable mass in the epigastrium which may be his xiphoid process but the patient reports he has not noticed this before.  Doubt PE.  Labs are normal.  Will obtain CT scan   ----------------------------------------- 9:07 AM on 04/28/2023 ----------------------------------------- CT angiogram negative for acute findings.  Serial troponins negative.  No evidence of any mass or dissection.  Stable for discharge.  Symptoms most likely due to recent testosterone injection side effects.      FINAL CLINICAL IMPRESSION(S) / ED DIAGNOSES   Final diagnoses:  Atypical chest pain     Rx / DC Orders   ED Discharge Orders     None        Note:  This document was prepared using Dragon voice recognition software  and may include unintentional dictation errors.   Sharman Cheek, MD 04/28/23 [phone]

## 2023-04-28 NOTE — Discharge Instructions (Signed)
Your lab tests and CT scan were all okay today.  Continue your medications, and follow-up with your doctor if symptoms do not resolve in the next 24 hours.

## 2023-06-22 ENCOUNTER — Telehealth: Payer: Self-pay | Admitting: Pharmacist

## 2023-06-22 NOTE — Telephone Encounter (Signed)
Outreach Note  06/22/2023 Name: Keyone Schrage MRN: [id number] DOB: [birth year]  I connected with [name] on 06/22/23 by telephone outreach and verified that I am speaking with the correct person using two identifiers.  Patient appearing on report for [place] Metric Hypertension Control due to last documented ambulatory blood pressure of 143/88 on 10/01/2022. No next appointment with PCP is scheduled   Outreached patient to discuss hypertension control and medication management.   Patient reports plans to contact office to schedule follow up appointment with PCP. Denies any needs at this time.  Follow Up Plan: No follow up needed.  Estelle Grumbles, PharmD, [facility] Clinical Pharmacist [facility] [phone]

## 2023-06-28 ENCOUNTER — Other Ambulatory Visit: Payer: Self-pay | Admitting: Family Medicine

## 2023-06-28 DIAGNOSIS — I10 Essential (primary) hypertension: Secondary | ICD-10-CM

## 2023-06-29 NOTE — Telephone Encounter (Signed)
OV needed for additional refills.  Requested Prescriptions  Pending Prescriptions Disp Refills   amLODipine (NORVASC) 5 MG tablet [Pharmacy Med Name: AMLODIPINE BESYLATE 5 MG TAB] 30 tablet 0    Sig: TAKE 1 TABLET (5 MG TOTAL) BY MOUTH DAILY.     Cardiovascular: Calcium Channel Blockers 2 Failed - 06/28/2023  8:30 AM      Failed - Last BP in normal range    BP Readings from Last 1 Encounters:  04/28/23 (!) 122/92         Failed - Valid encounter within last 6 months    Recent Outpatient Visits           1 year ago Annual physical exam   [facility] Smitty Cords, DO   1 year ago Gastroesophageal reflux disease without esophagitis   [facility] Althea Charon, Netta Neat, DO       Future Appointments             In 3 weeks Stoioff, Verna Czech, MD [facility] [place]            Passed - Last Heart Rate in normal range    Pulse Readings from Last 1 Encounters:  04/28/23 71         '

## 2023-07-22 ENCOUNTER — Other Ambulatory Visit: Payer: Self-pay | Admitting: Family Medicine

## 2023-07-22 DIAGNOSIS — I10 Essential (primary) hypertension: Secondary | ICD-10-CM

## 2023-07-23 ENCOUNTER — Other Ambulatory Visit: Payer: BC Managed Care – PPO

## 2023-07-23 DIAGNOSIS — E349 Endocrine disorder, unspecified: Secondary | ICD-10-CM

## 2023-07-23 DIAGNOSIS — Z125 Encounter for screening for malignant neoplasm of prostate: Secondary | ICD-10-CM | POA: Diagnosis not present

## 2023-07-24 ENCOUNTER — Ambulatory Visit: Payer: BC Managed Care – PPO | Admitting: Urology

## 2023-07-24 ENCOUNTER — Encounter: Payer: Self-pay | Admitting: Urology

## 2023-07-24 VITALS — BP 134/77 | HR 88 | Ht 69.0 in | Wt 215.0 lb

## 2023-07-24 DIAGNOSIS — Z125 Encounter for screening for malignant neoplasm of prostate: Secondary | ICD-10-CM

## 2023-07-24 DIAGNOSIS — E291 Testicular hypofunction: Secondary | ICD-10-CM | POA: Diagnosis not present

## 2023-07-24 LAB — LUTEINIZING HORMONE: LH: 0.7 m[IU]/mL — ABNORMAL LOW (ref 1.7–8.6)

## 2023-07-24 LAB — TESTOSTERONE: Testosterone: 807 ng/dL (ref 264–916)

## 2023-07-24 LAB — HEMATOCRIT: Hematocrit: 50.3 % (ref 37.5–51.0)

## 2023-07-24 MED ORDER — TADALAFIL 20 MG PO TABS: 20.0000 mg | ORAL_TABLET | Freq: Every day | ORAL | 0 refills | Status: AC | PRN

## 2023-07-24 NOTE — Progress Notes (Signed)
I, Fred Reeves, acting as a scribe for Fred Altes, MD., have documented all relevant documentation on the behalf of Fred Altes, MD, as directed by Fred Altes, MD while in the presence of Fred Altes, MD.  07/24/2023 4:24 PM   Fred Reeves 1961-03-24 [id number]  Referring provider: Smitty Cords, DO [address] [place],  [place] [address]  Chief Complaint  Patient presents with   Hypogonadism in male   Urologic history: 1. Hypogonadism Symptoms tiredness, fatigue, ED.  TRT started April 2023 200 mg Q2 weeks.  HPI: Fred Reeves is a 62 y.o. male presents for annual follow-up.  No significant problems since last visit. Remains on testosterone injections.  Labs 07/22/22 testosterone 807, HCT 50.3 Remains on tadalafil for ED.   PMH: Past Medical History:  Diagnosis Date   Hypertension     Surgical History: Past Surgical History:  Procedure Laterality Date   COLONOSCOPY     COLONOSCOPY WITH PROPOFOL N/A 04/10/2020   Procedure: COLONOSCOPY WITH PROPOFOL;  Surgeon: Fred Mood, MD;  Location: [facility] ENDOSCOPY;  Service: Gastroenterology;  Laterality: N/A;    Home Medications:  Allergies as of 07/24/2023   No Known Allergies      Medication List        Accurate as of July 24, 2023  4:24 PM. If you have any questions, ask your nurse or doctor.          amLODipine 5 MG tablet Commonly known as: NORVASC TAKE 1 TABLET (5 MG TOTAL) BY MOUTH DAILY.   B-D BLUNT FILL NEEDLE 18G X 1-1/2" Misc Generic drug: NEEDLE (DISP) 18 G 1 each by Does not apply route every 14 (fourteen) days.   GLUCOSAMINE PO Take by mouth.   pantoprazole 40 MG tablet Commonly known as: PROTONIX Take 1 tablet (40 mg total) by mouth 2 (two) times daily.   promethazine-dextromethorphan 6.25-15 MG/5ML syrup Commonly known as: PROMETHAZINE-DM Take 5 mLs by mouth 4 (four) times daily as needed.   rosuvastatin 10 MG tablet Commonly known as: CRESTOR Take 1  tablet (10 mg total) by mouth at bedtime.   tadalafil 20 MG tablet Commonly known as: CIALIS Take 1 tablet (20 mg total) by mouth daily as needed for erectile dysfunction.   testosterone cypionate 200 MG/ML injection Commonly known as: DEPOTESTOSTERONE CYPIONATE Inject 1 mL (200 mg total) into the muscle every 14 (fourteen) days.   tiZANidine 2 MG tablet Commonly known as: ZANAFLEX Take 2 mg by mouth at bedtime.        Allergies: No Known Allergies  Family History: Family History  Problem Relation Age of Onset   Stroke Mother    Aneurysm Brother     Social History:  reports that he quit smoking about 34 years ago. His smoking use included cigarettes. He started smoking about 50 years ago. He has a 8 pack-year smoking history. He has never used smokeless tobacco. He reports current alcohol use of about 1.0 - 2.0 standard drink of alcohol per week. He reports that he does not use drugs.   Physical Exam: BP 134/77   Pulse 88   Ht 5\' 9"  (1.753 m)   Wt 215 lb (97.5 kg)   BMI 31.75 kg/m   Constitutional:  Alert, No acute distress. HEENT:  AT Psychiatric: Normal Reeves and affect.   Assessment & Plan:    1. Hypogonadism Symptom improvement on TRT Hematocrit slightly elevated and recommend blood donation.  It appears a luteinizing hormone  was drawn instead of a PSA. Will add on a PSA to that blood draw.  Lab visit 6 months testosterone, hematocrit.  1 year follow-up with testosterone, PSA, hematocrit.   I have reviewed the above documentation for accuracy and completeness, and I agree with the above.   Fred Altes, MD  [facility] [address], [address] [place], [place] [address] [phone]

## 2023-07-24 NOTE — Telephone Encounter (Signed)
Requested medication (s) are due for refill today: Yes  Requested medication (s) are on the active medication list: Yes  Last refill:  06/29/23  Future visit scheduled: No  Notes to clinic:  Pharmacy requests 90 day supply and diagnosis code.    Requested Prescriptions  Pending Prescriptions Disp Refills   amLODipine (NORVASC) 5 MG tablet [Pharmacy Med Name: AMLODIPINE BESYLATE 5 MG TAB] 90 tablet 1    Sig: TAKE 1 TABLET (5 MG TOTAL) BY MOUTH DAILY.     Cardiovascular: Calcium Channel Blockers 2 Failed - 07/22/2023  1:30 PM      Failed - Last BP in normal range    BP Readings from Last 1 Encounters:  04/28/23 (!) 122/92         Failed - Valid encounter within last 6 months    Recent Outpatient Visits           1 year ago Annual physical exam   [facility] Smitty Cords, DO   1 year ago Gastroesophageal reflux disease without esophagitis   [facility] Althea Charon, Netta Neat, DO       Future Appointments             Today Lonna Cobb, Verna Czech, MD [facility] [place]            Passed - Last Heart Rate in normal range    Pulse Readings from Last 1 Encounters:  04/28/23 71

## 2023-07-25 ENCOUNTER — Encounter: Payer: Self-pay | Admitting: Urology

## 2023-07-28 LAB — SPECIMEN STATUS REPORT

## 2023-07-28 LAB — PSA: Prostate Specific Ag, Serum: 1 ng/mL (ref 0.0–4.0)

## 2023-10-26 ENCOUNTER — Other Ambulatory Visit: Payer: Self-pay | Admitting: Internal Medicine

## 2023-10-26 DIAGNOSIS — E042 Nontoxic multinodular goiter: Secondary | ICD-10-CM

## 2023-10-28 ENCOUNTER — Ambulatory Visit
Admission: RE | Admit: 2023-10-28 | Discharge: 2023-10-28 | Disposition: A | Discharge status: Home / Self Care | Payer: No Typology Code available for payment source | Source: Ambulatory Visit | Attending: Internal Medicine | Admitting: Internal Medicine

## 2023-10-28 DIAGNOSIS — E042 Nontoxic multinodular goiter: Secondary | ICD-10-CM | POA: Insufficient documentation

## 2024-01-22 ENCOUNTER — Other Ambulatory Visit: Payer: Self-pay

## 2024-07-20 ENCOUNTER — Other Ambulatory Visit: Payer: Self-pay

## 2024-07-22 ENCOUNTER — Ambulatory Visit: Payer: Self-pay | Admitting: Urology

## 2024-07-26 ENCOUNTER — Encounter: Payer: Self-pay | Admitting: Urology

## 2024-10-27 ENCOUNTER — Encounter: Payer: Self-pay | Admitting: Internal Medicine

## 2024-10-28 ENCOUNTER — Other Ambulatory Visit (HOSPITAL_COMMUNITY): Payer: Self-pay | Admitting: Internal Medicine

## 2024-10-28 DIAGNOSIS — E042 Nontoxic multinodular goiter: Secondary | ICD-10-CM

## 2024-10-28 DIAGNOSIS — E041 Nontoxic single thyroid nodule: Secondary | ICD-10-CM

## 2024-10-31 ENCOUNTER — Encounter: Payer: Self-pay | Admitting: Radiology

## 2024-10-31 NOTE — Progress Notes (Signed)
 Hughes Simmonds, MD  Daralene Ferol FALCON, RT Cc: Baldwin Rosella L PROCEDURE / BIOPSY REVIEW Date: 10/31/24  Requested Biopsy site: L thyroid  Reason for request: nodule Imaging review: Best seen on US  Thyroid   Decision: Declined / Defer  Additional comments: @Referring  MD: Consider repeat US  Thyroid , for 1 Yr follow up as recommended, which is due @Schedulers . No FNA at this time. Nodule was TR-3 on referenced US .  Please contact me with questions, concerns, or if issue pertaining to this request arise.  Simmonds Hughes, MD [facility]       Previous Messages    ----- Message ----- From: Daralene Ferol FALCON, RT Sent: 10/28/2024  11:08 AM EST To: Ir Procedure Requests Subject: US  FNA BX THYROID  1ST LESION AFIRMA            Procedure :US  FNA BX THYROID  1ST LESION AFIRMA  Reason :Left Sided TR4 nodule, 2cm Dx: Nontoxic multinodular goiter [E04.2 (ICD-10-CM)]; Thyroid  nodule [E04.1 (ICD-10-CM)]  History :US  THYROID  Accession [id number] CHL (PACS - This is under his full name [name] and I did confirm with ID it is the same person) US  THYROID  (Accession [id number]) (Order [id number])  Provider:Averneni, Floreen, MD  Provider contact ; [phone]
